# Patient Record
Sex: Female | Born: 1950 | Race: White | Hispanic: No | Marital: Married | Smoking: Never smoker
Health system: Southern US, Community
[De-identification: ages and names within clinical notes are randomized; demographics above are authoritative.]

---

## 1998-01-07 ENCOUNTER — Other Ambulatory Visit: Admission: RE | Admit: 1998-01-07 | Discharge: 1998-01-07 | Payer: Self-pay | Admitting: *Deleted

## 1999-01-11 ENCOUNTER — Other Ambulatory Visit: Admission: RE | Admit: 1999-01-11 | Discharge: 1999-01-11 | Payer: Self-pay | Admitting: *Deleted

## 2000-04-03 ENCOUNTER — Encounter (INDEPENDENT_AMBULATORY_CARE_PROVIDER_SITE_OTHER): Payer: Self-pay | Admitting: Specialist

## 2000-04-03 ENCOUNTER — Other Ambulatory Visit: Admission: RE | Admit: 2000-04-03 | Discharge: 2000-04-03 | Payer: Self-pay | Admitting: *Deleted

## 2001-04-09 ENCOUNTER — Other Ambulatory Visit: Admission: RE | Admit: 2001-04-09 | Discharge: 2001-04-09 | Payer: Self-pay | Admitting: *Deleted

## 2002-05-22 ENCOUNTER — Other Ambulatory Visit: Admission: RE | Admit: 2002-05-22 | Discharge: 2002-05-22 | Payer: Self-pay | Admitting: *Deleted

## 2002-12-15 ENCOUNTER — Encounter: Payer: Self-pay | Admitting: Emergency Medicine

## 2002-12-15 ENCOUNTER — Emergency Department (HOSPITAL_COMMUNITY): Admission: EM | Admit: 2002-12-15 | Discharge: 2002-12-15 | Payer: Self-pay | Admitting: Emergency Medicine

## 2003-07-13 ENCOUNTER — Other Ambulatory Visit: Admission: RE | Admit: 2003-07-13 | Discharge: 2003-07-13 | Payer: Self-pay | Admitting: *Deleted

## 2009-03-09 ENCOUNTER — Encounter: Admission: RE | Admit: 2009-03-09 | Discharge: 2009-03-09 | Payer: Self-pay | Admitting: Family Medicine

## 2009-03-09 IMAGING — US US SOFT TISSUE HEAD/NECK
1 series · 14 of 25 positions shown · non-contrast
Comparison: None

CLINICAL DATA: Right neck swelling -

THYROID ULTRASOUND
TECHNIQUE: Ultrasound examination of the thyroid gland and
adjacent soft tissues was performed.

[Series 1: us soft tissue head/neck · 0.06mm/px · 14 of 42 slices shown]
[im 1/42]
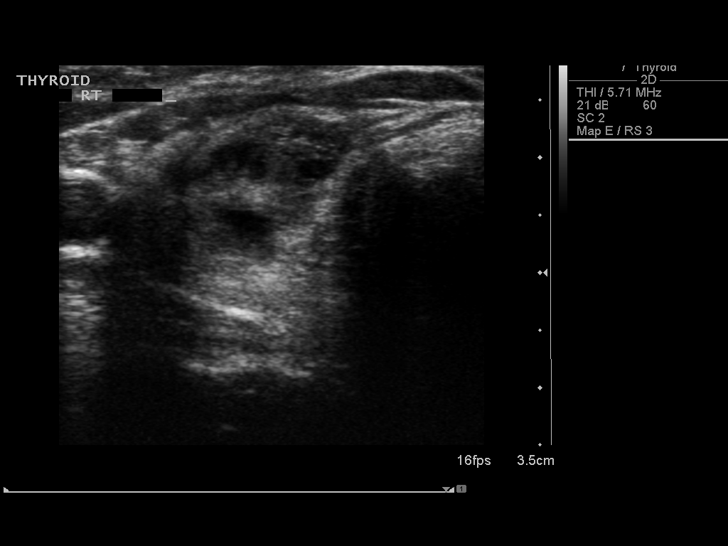
[im 4/42]
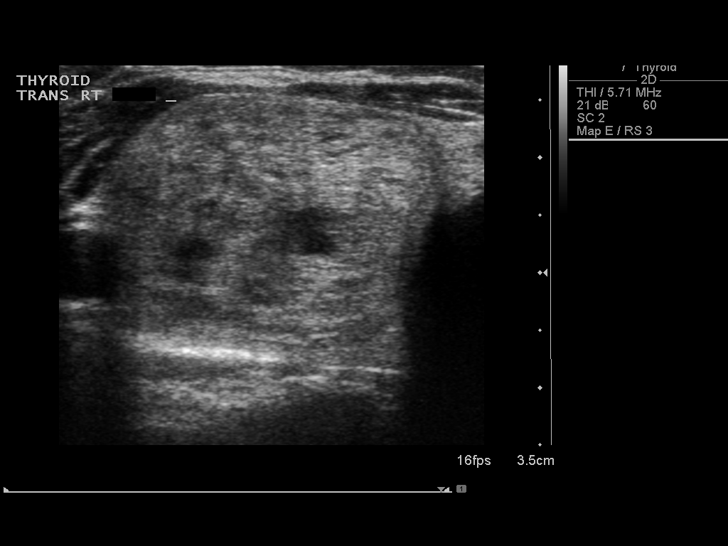
[im 7/42]
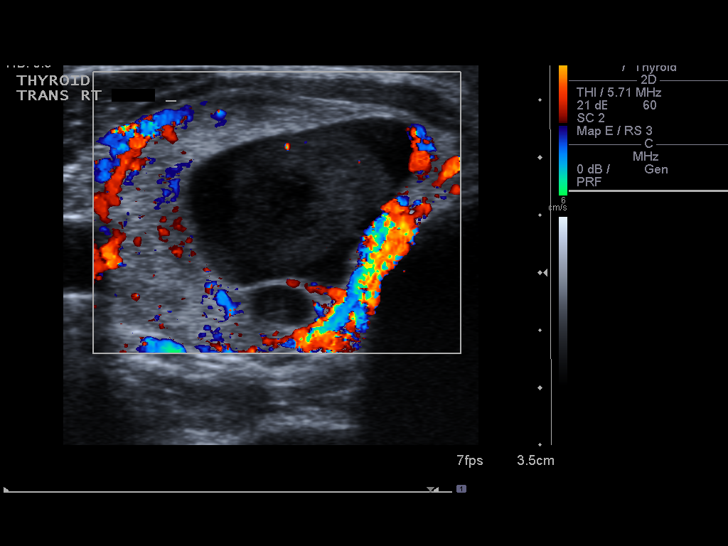
[im 11/42]
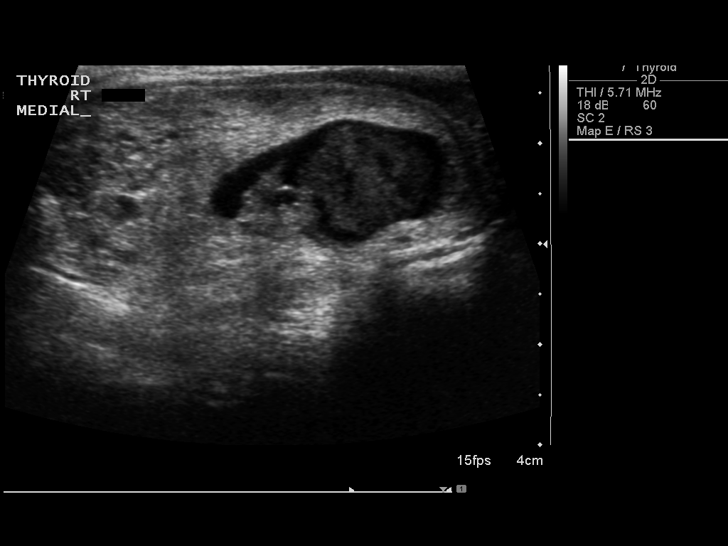
[im 14/42]
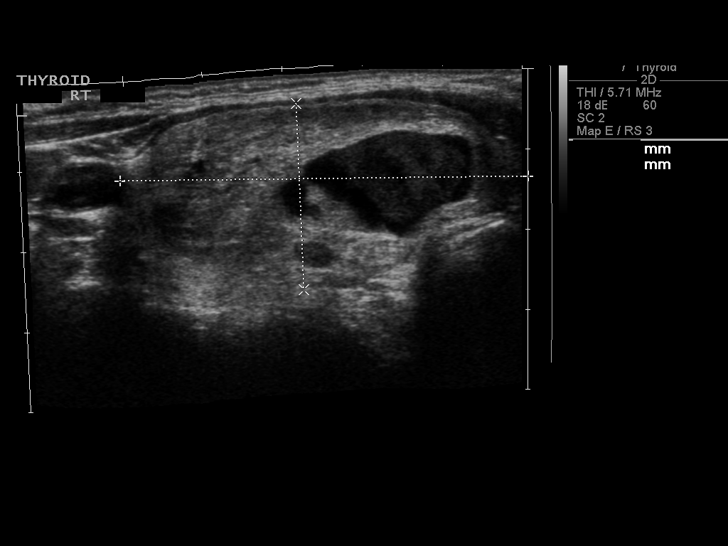
[im 16/42]
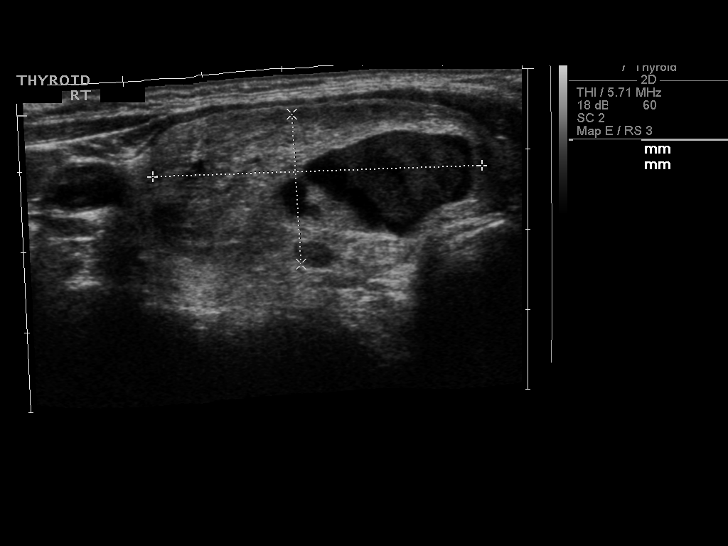
[im 19/42]
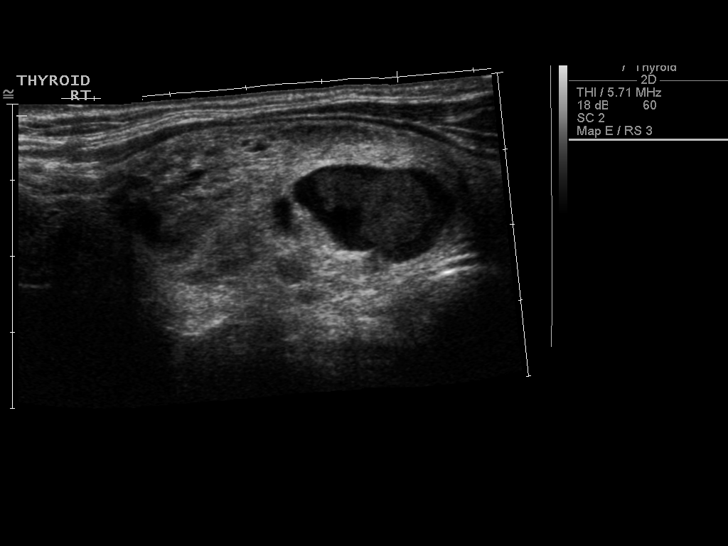
[im 23/42]
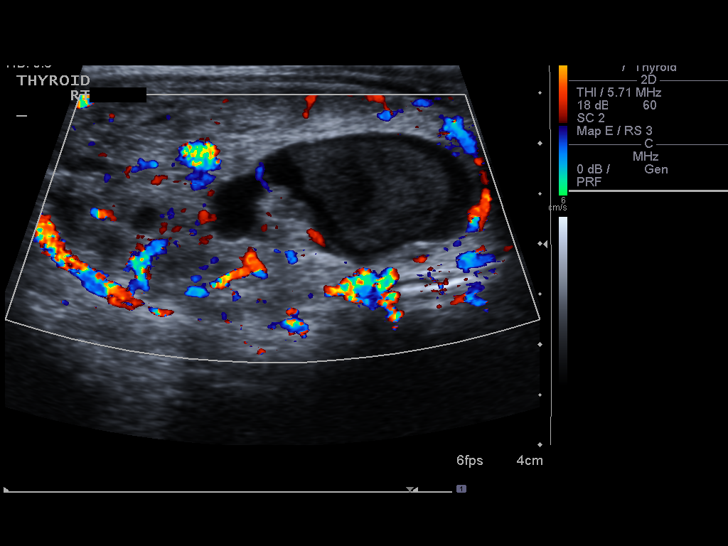
[im 26/42]
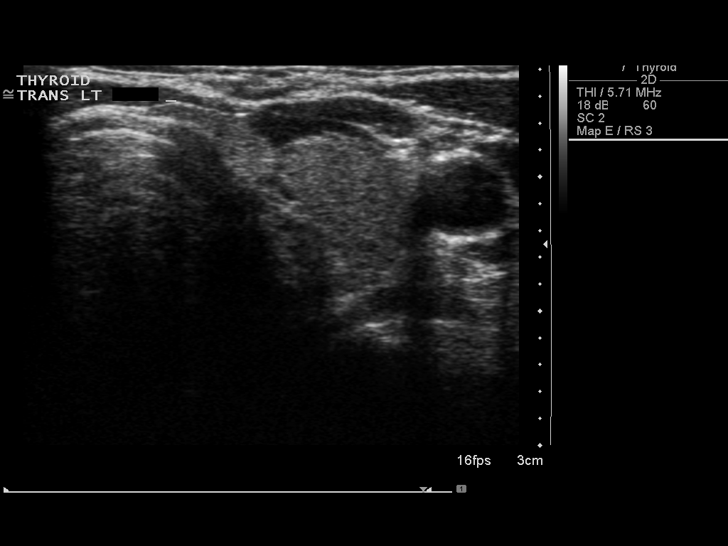
[im 28/42]
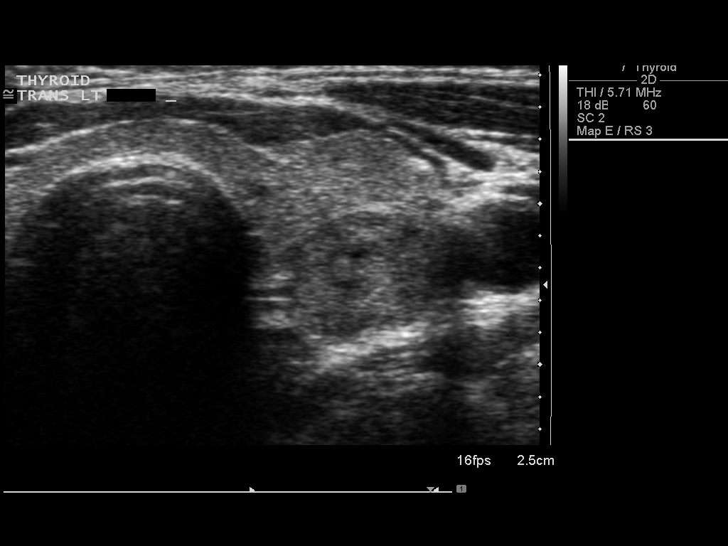
[im 31/42]
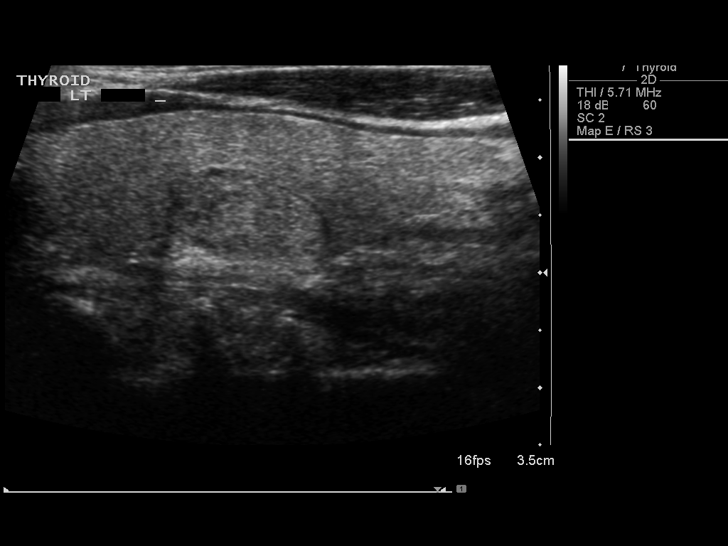
[im 35/42]
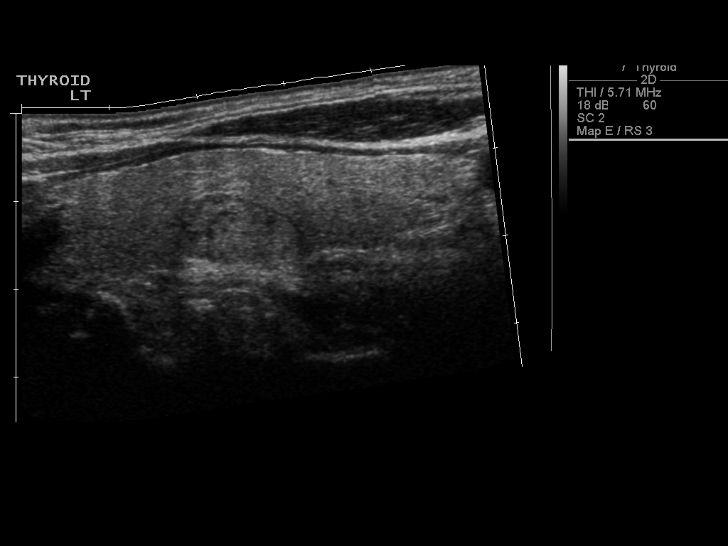
[im 38/42]
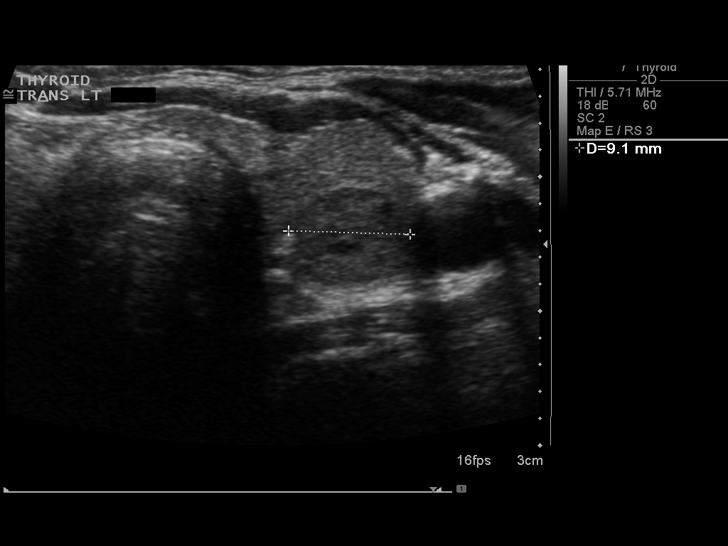
[im 42/42]
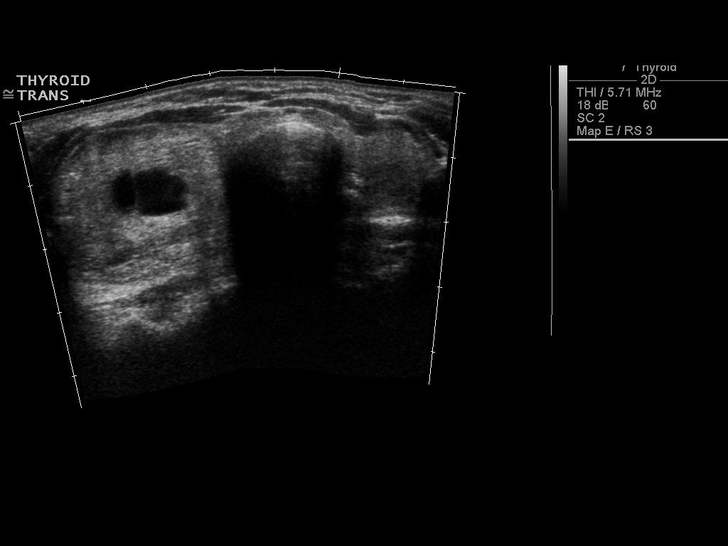

[14 of 25 positions shown; findings below may reference images not displayed]

FINDINGS: Slight enlargement right lobe thyroid measures 5.6 cm
long X 2.5 cm AP X 3.4 cm wide.  Left lobe thyroid and isthmus are
normal in size with left lobe measuring 5.0 cm long X 1.4 cm AP X
1.3 cm wide and isthmus 2 mm AP thickness.  Thyroid echotexture is
homogeneous.  At the central right lobe thyroid is solid and cystic
complex mass measuring 4.6 cm long X 2.0 cm AP X 2.9 cm wide.  At
the central posterior left lobe that is solitary solid nodule
measuring 1.6 cm long X zero point cm AP X 0.9 cm wide.
IMPRESSION: 1.  Slight enlargement right lobe thyroid with nonspecific central
solid and cystic mass.  Consider ultrasound-guided biopsy for
further characterization as clinically indicated.  Alternatively,
follow up thyroid ultrasound at 6-month intervals over 2 years to
assure stability considered.
2.  Probable left thyroid lobe adenoma.

## 2009-04-07 ENCOUNTER — Encounter (INDEPENDENT_AMBULATORY_CARE_PROVIDER_SITE_OTHER): Payer: Self-pay | Admitting: Diagnostic Radiology

## 2009-04-07 ENCOUNTER — Encounter: Admission: RE | Admit: 2009-04-07 | Discharge: 2009-04-07 | Payer: Self-pay | Admitting: Internal Medicine

## 2009-04-07 ENCOUNTER — Other Ambulatory Visit: Admission: RE | Admit: 2009-04-07 | Discharge: 2009-04-07 | Payer: Self-pay | Admitting: Diagnostic Radiology

## 2009-04-07 IMAGING — US US BIOPSY
1 series · 10 of 10 positions shown · non-contrast
Comparison: none

Addendum Begins

The fine needle aspirations were obtained with 25 gauge needles.
Addendum Ends
CLINICAL HISTORY: 58-year female with bilateral thyroid nodules.

[Series 1: us biopsy · 10 acquisitions, 10 frames shown]
[im 1/10]
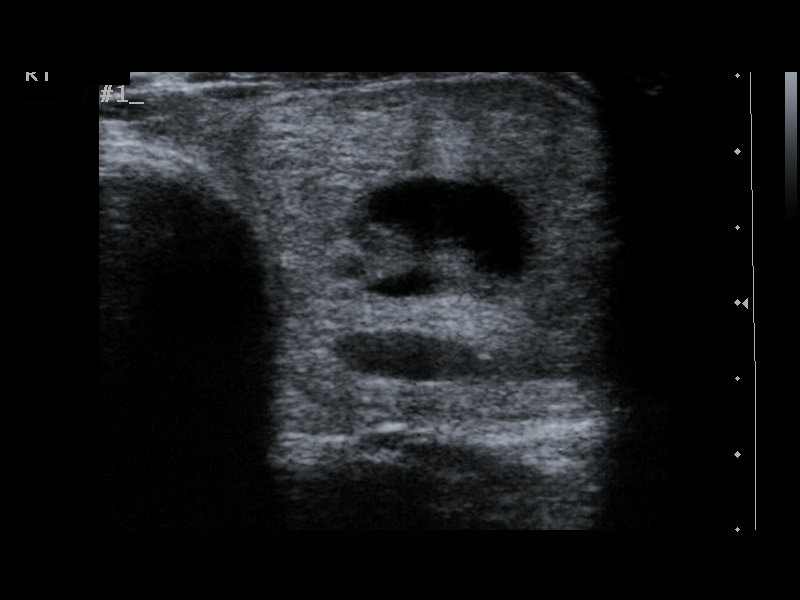
[im 2/10]
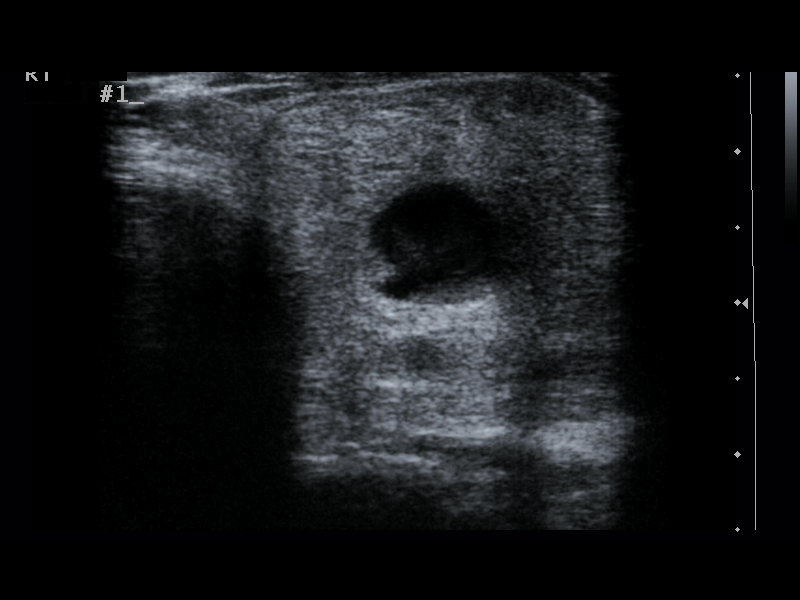
[im 3/10]
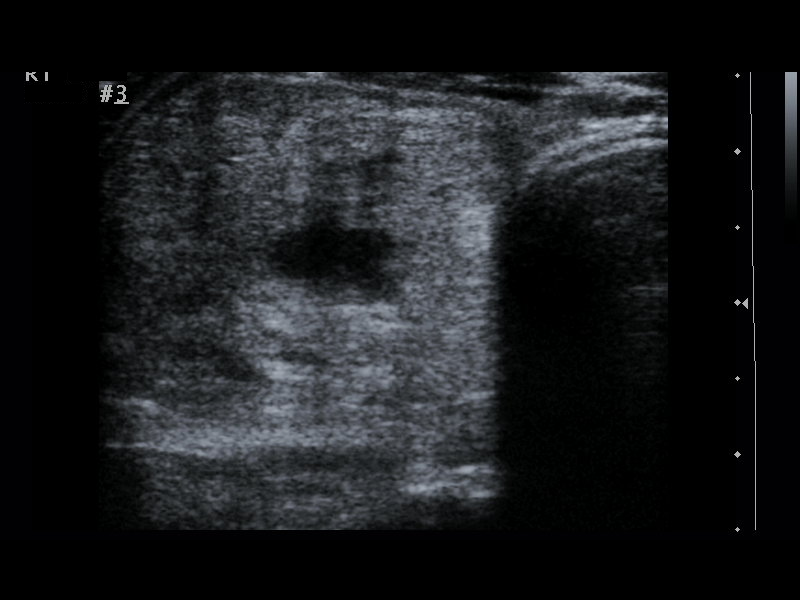
[im 4/10]
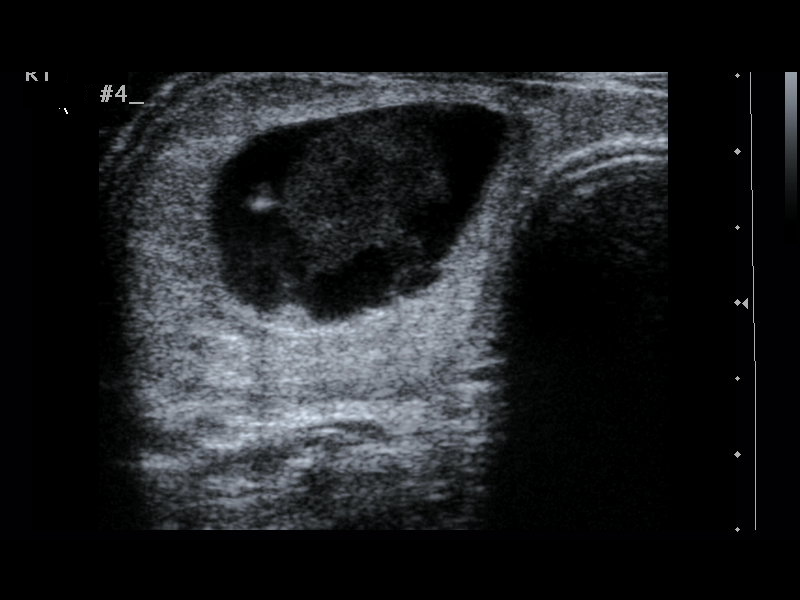
[im 5/10]
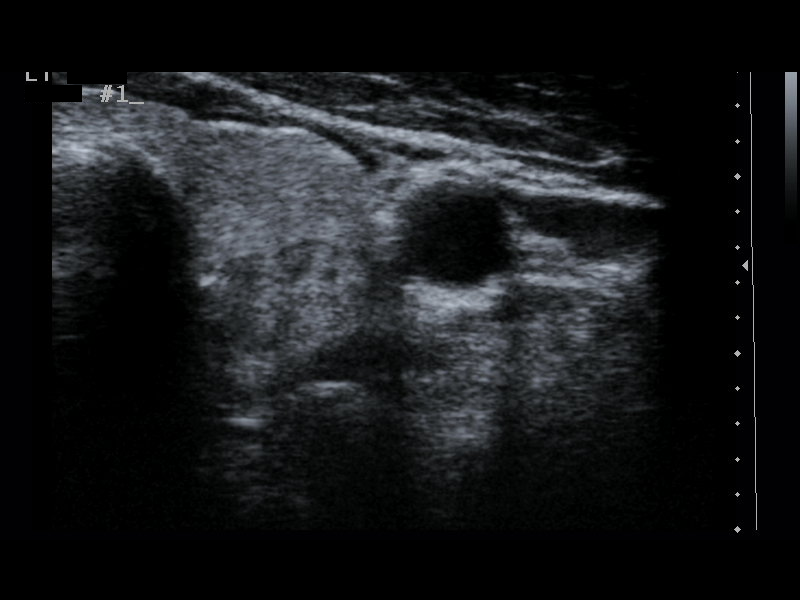
[im 6/10]
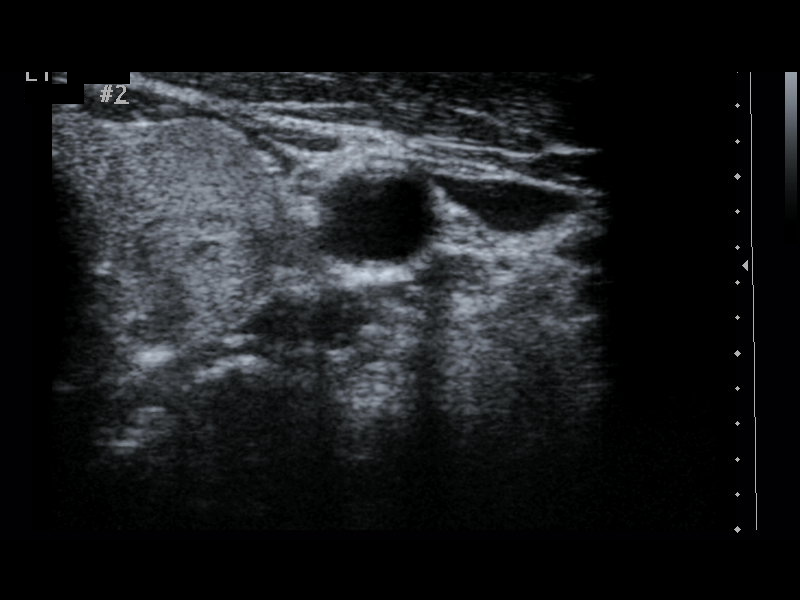
[im 7/10]
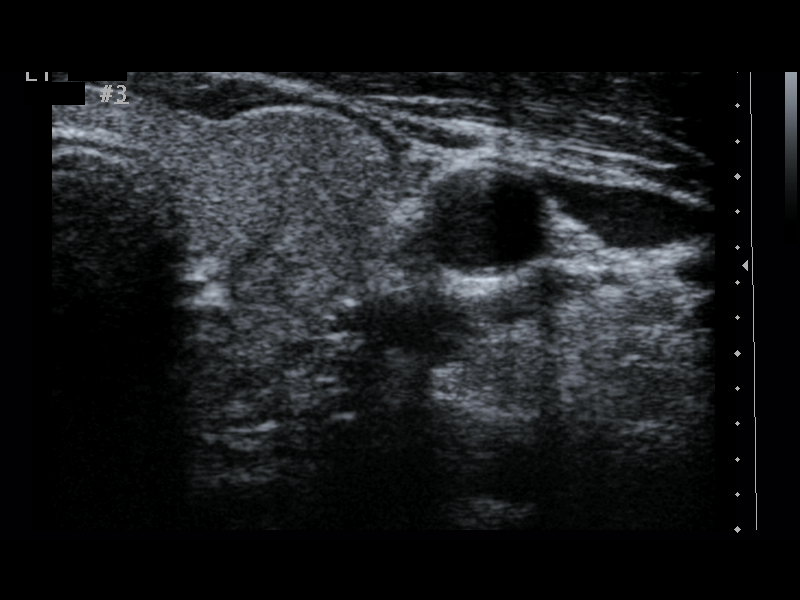
[im 8/10]
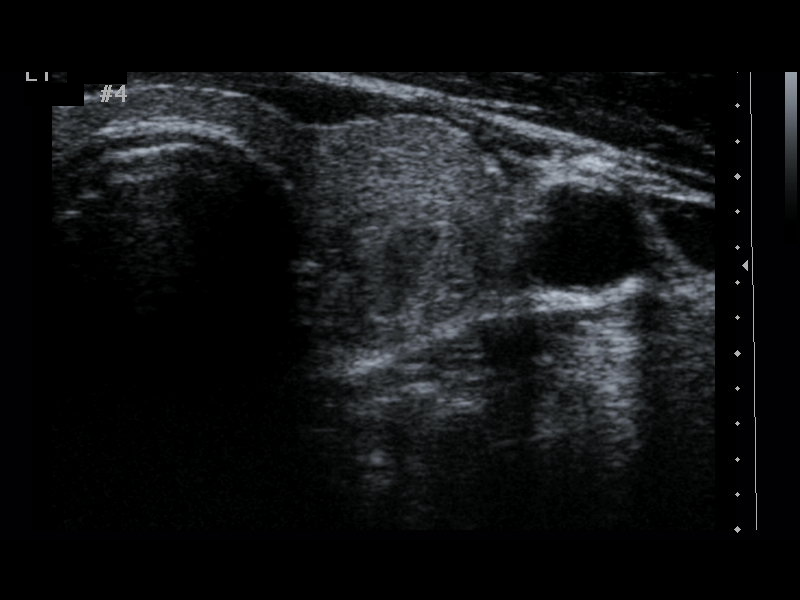
[im 9/10]
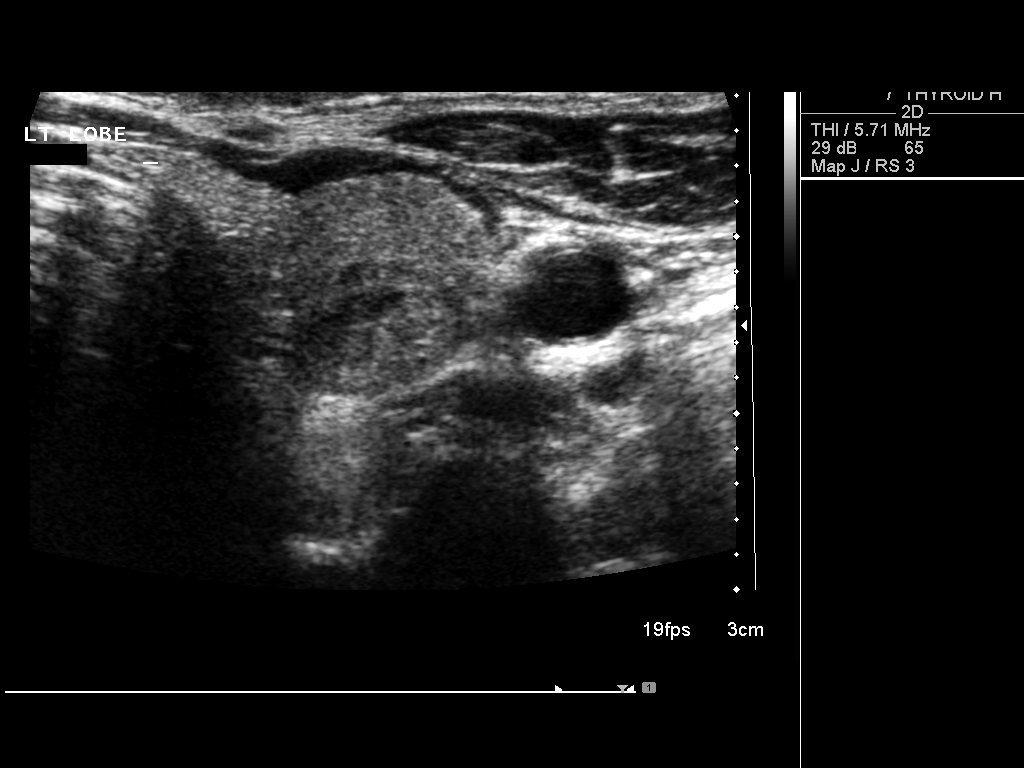
[im 10/10]
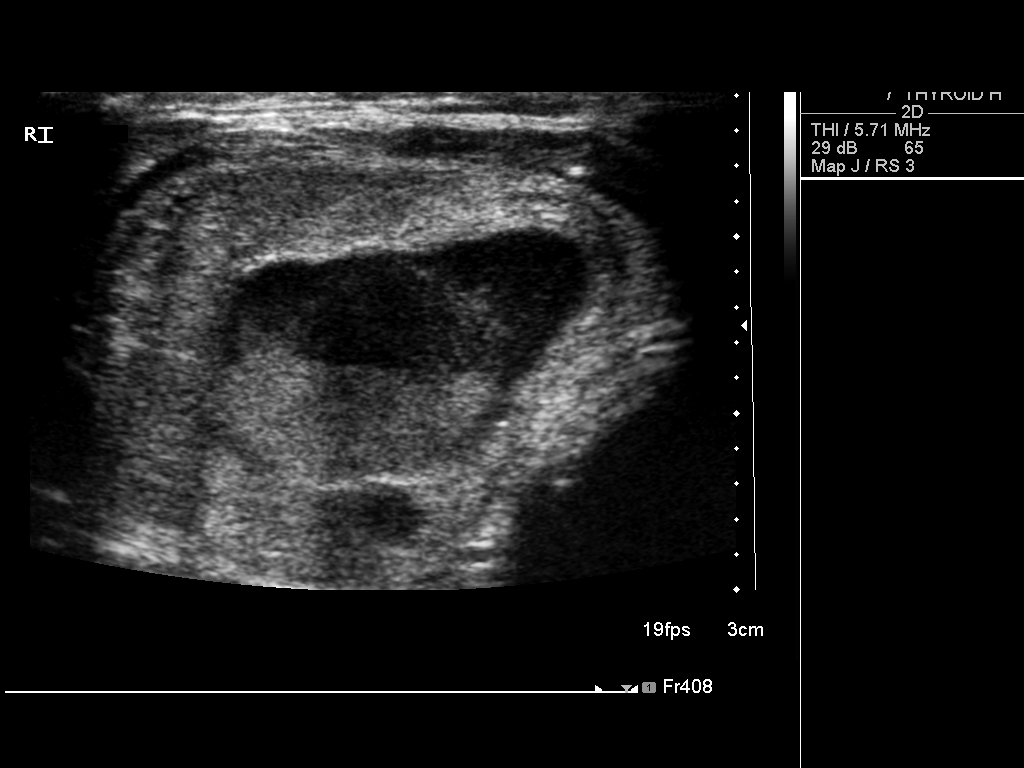

[10 of 10 positions shown; findings below may reference images not displayed]

PROCEDURE(S): ULTRASOUND GUIDED FINE NEEDLE ASPIRATIONS OF
BILATERAL THYROID NODULE IS

Medications:None

Moderate sedation time:None

Fluoroscopy time: None

Procedure:The procedure was explained to the patient.  The risks
and benefits of the procedure were discussed and the patient's
questions were addressed.  Informed consent was obtained from the
patient. The neck was prepped and draped in a sterile fashion using
Betadine.  The right neck was anesthetized was 1% lidocaine.  Four
fine needle aspirations were obtained in the right solid and cystic
lesion using ultrasound guidance.  1 ml of dark bloody fluid was
removed from the cystic component of the lesion.  Attention was
directed to the left neck.  The left neck was anesthetized with 1%
lidocaine.  The nodule along the posterior aspect of the left
thyroid lobe was targeted.  Four fine needle aspirations were
obtained in this nodule using ultrasound guidance.  There were no
immediate complications.
FINDINGS: Large solid and cystic nodule involving the right thyroid
lobe.  There was echogenic material within the cystic component
that did not aspirate well.   Nodule in the posterior left thyroid
lobe.
IMPRESSION: Ultrasound guided bilateral thyroid nodule fine needle
aspirations.

## 2010-03-30 ENCOUNTER — Encounter: Admission: RE | Admit: 2010-03-30 | Discharge: 2010-03-30 | Payer: Self-pay | Admitting: Internal Medicine

## 2010-03-30 IMAGING — US US SOFT TISSUE HEAD/NECK
1 series · 14 of 25 positions shown · non-contrast
Comparison: [DATE]

CLINICAL DATA: Goiter.  Previous aspiration biopsy of bilateral
nodules [DATE]

THYROID ULTRASOUND
TECHNIQUE: Ultrasound examination of the thyroid gland and adjacent
soft tissues was performed.

[Series 1: us soft tissue head/neck · 0.08mm/px · 14 of 29 slices shown]
[im 1/29]
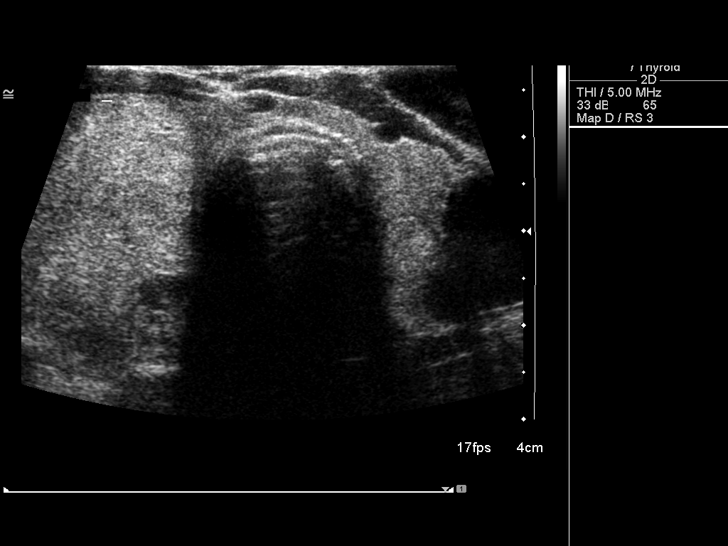
[im 3/29]
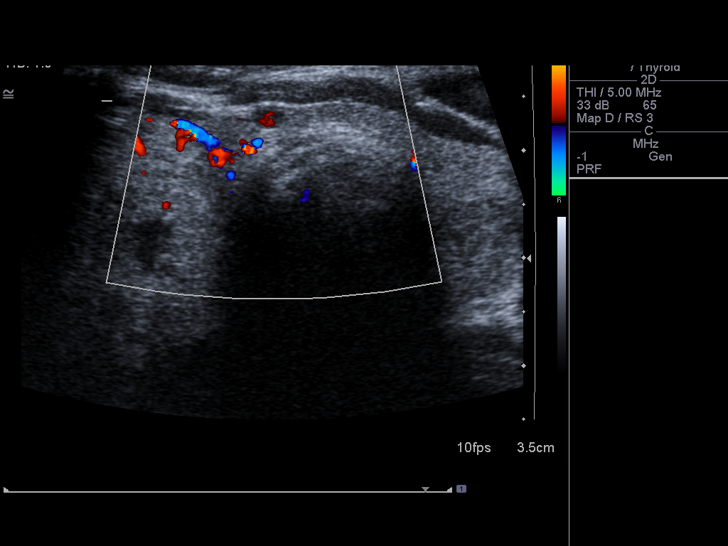
[im 5/29]
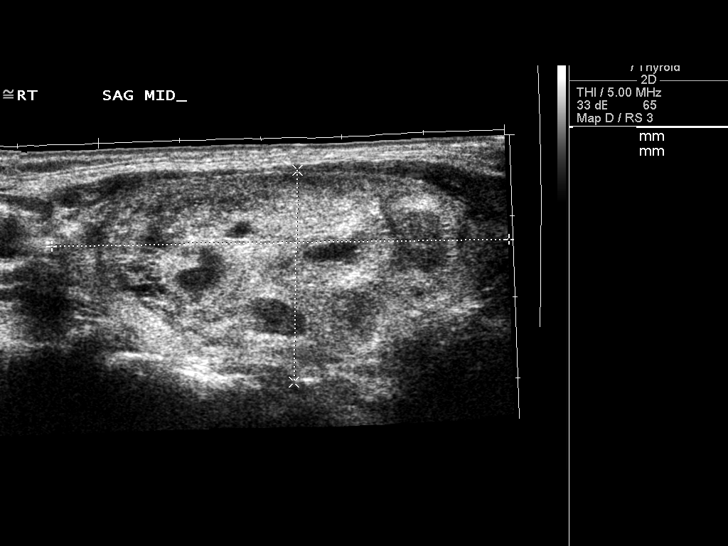
[im 8/29]
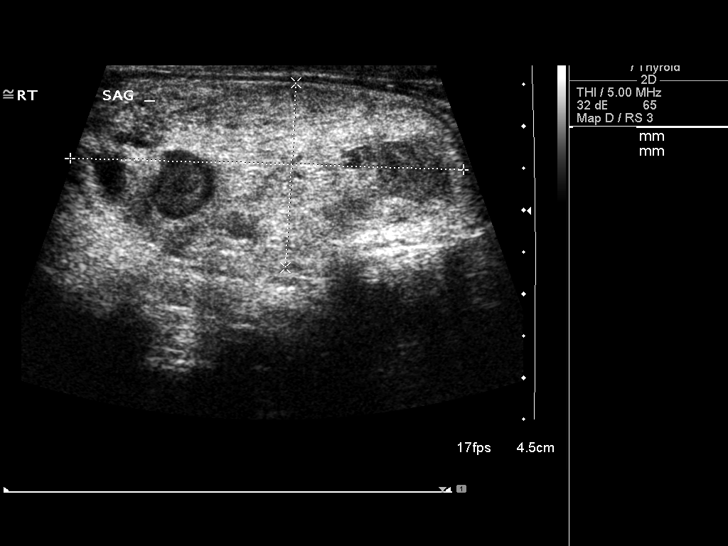
[im 10/29]
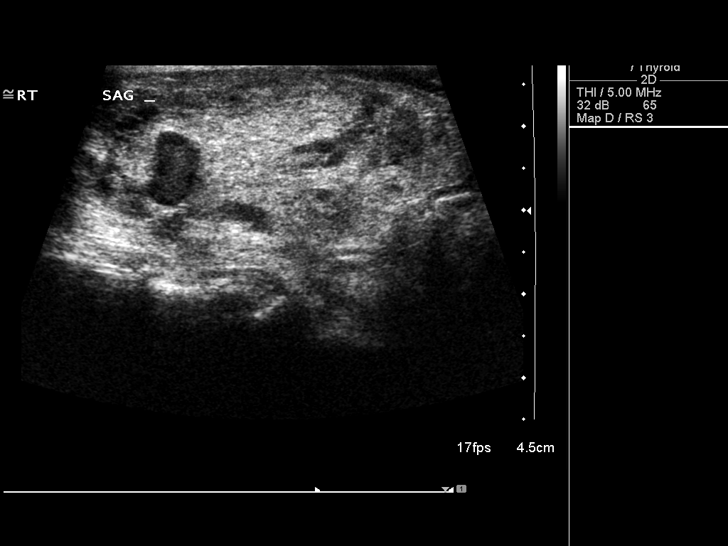
[im 11/29]
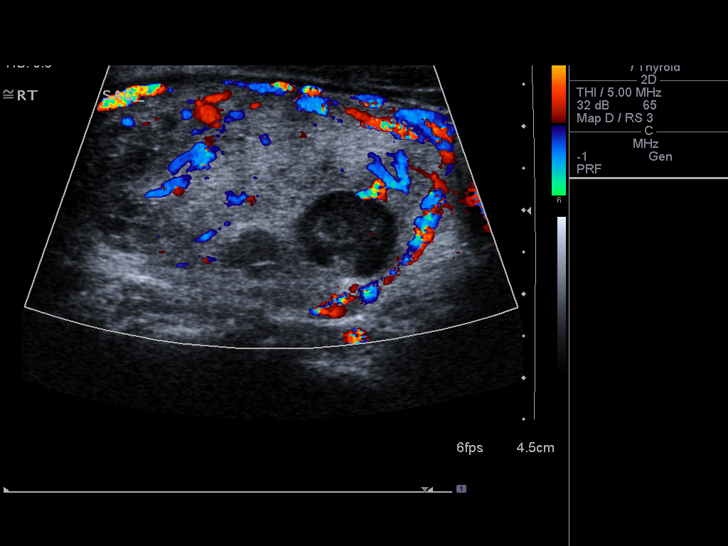
[im 13/29]
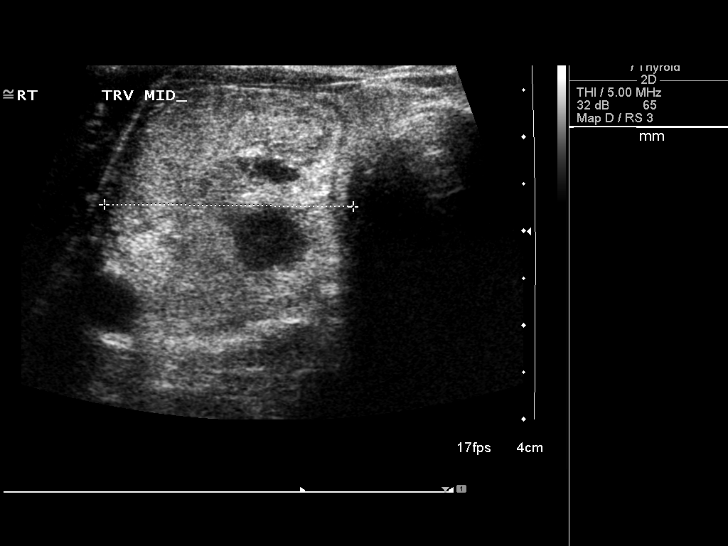
[im 16/29]
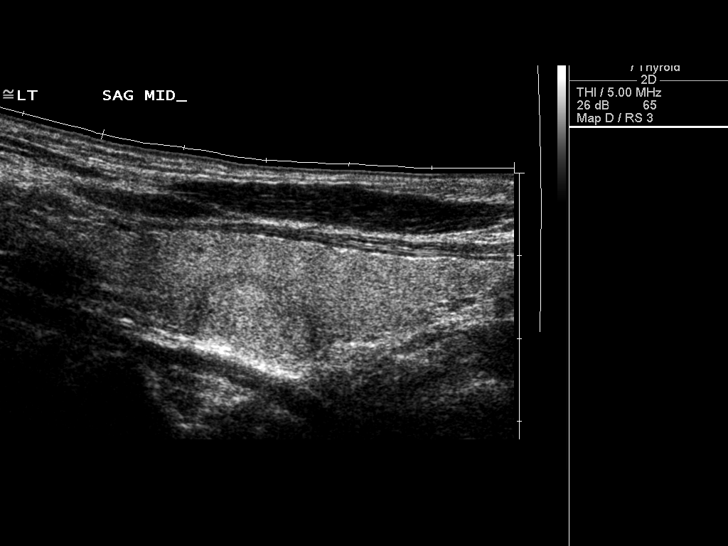
[im 18/29]
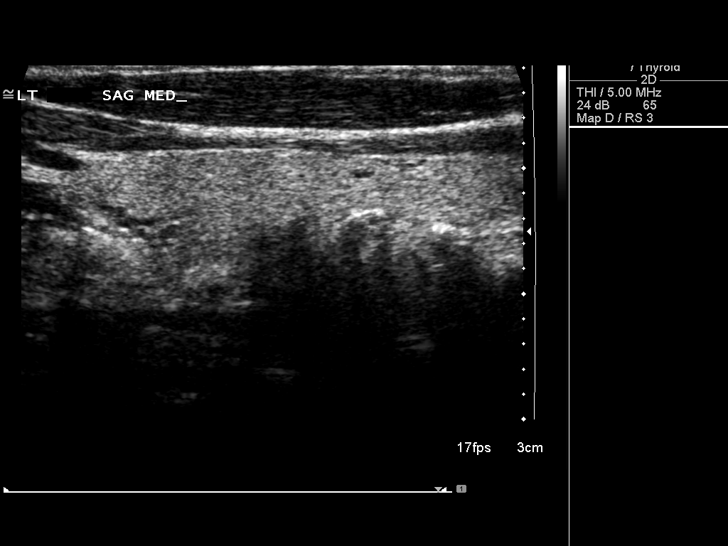
[im 19/29]
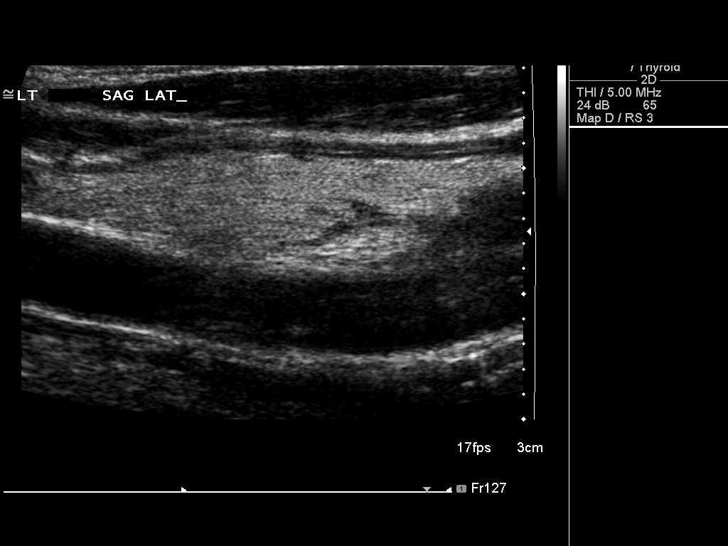
[im 22/29]
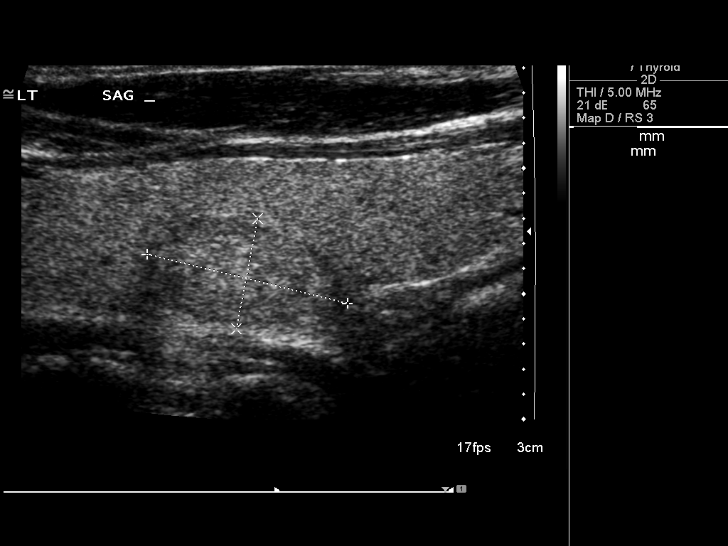
[im 24/29]
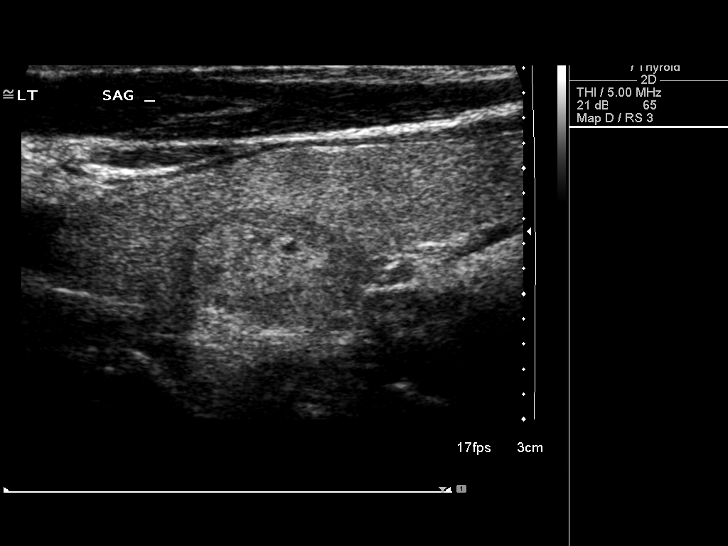
[im 26/29]
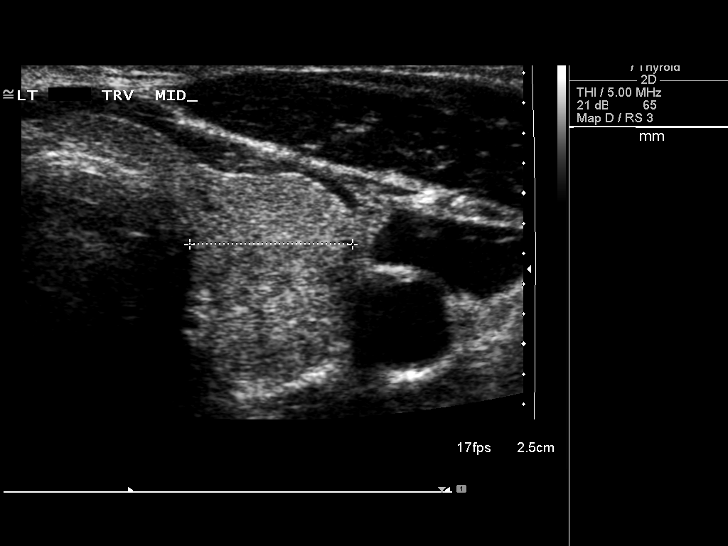
[im 29/29]
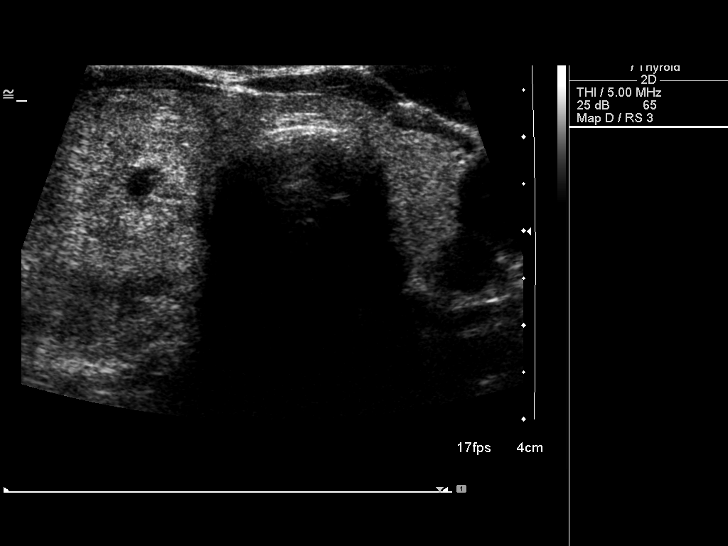

[14 of 25 positions shown; findings below may reference images not displayed]

FINDINGS: Right thyroid lobe:  26 x 26 x 56 mm, with an inhomogeneous
echotexture and a complex dominant nodule
Left thyroid lobe:  11 x 17 x 55 mm, relatively homogeneous
background parenchyma with a solitary nodule
Isthmus:  2 mm

Focal nodules:
22 x 24 x 47 mm complex mostly solid lesion, inferior right lobe
9 x 9 x 16 mm nearly isoechoic solid, mid left lobe

Lymphadenopathy:  None visualized.
IMPRESSION: 1.  Stable bilateral thyroid nodules.

## 2010-08-17 ENCOUNTER — Encounter: Payer: Self-pay | Admitting: Internal Medicine

## 2010-09-08 NOTE — Letter (Signed)
Summary: Colonoscopy Date Change Letter  [facility]  [address] [place], [place] [address]   Phone: [phone]  Fax: [phone]      August 17, 2010 MRN: [id number]   [facility] [address] [place], [place]  [address]   Dear Shelby Pace,   Previously you were recommended to have a repeat colonoscopy around this time. Your chart was recently reviewed by Dr. Lina Sar of [facility]. Follow up colonoscopy is now recommended in February 2105. This revised recommendation is based on current, nationally recognized guidelines for colorectal cancer screening and polyp surveillance. These guidelines are endorsed by the American Cancer Society, [employer] on Colorectal Cancer as well as numerous other major medical organizations.  Please understand that our recommendation assumes that you do not have any new symptoms such as bleeding, a change in bowel habits, anemia, or significant abdominal discomfort. If you do have any concerning GI symptoms or want to discuss the guideline recommendations, please call to arrange an office visit at your earliest convenience. Otherwise we will keep you in our reminder system and contact you 1-2 months prior to the date listed above to schedule your next colonoscopy.  Thank you,  Hedwig Morton. Juanda Chance, M.D.  [facility] [phone]

## 2011-11-13 IMAGING — CR DG ANKLE COMPLETE 3+V*L*
3 series · 3 of 3 positions shown · non-contrast
Comparison: None.

CLINICAL DATA: Twisting.  Pain.

LEFT ANKLE COMPLETE - 3+ VIEW

[x ankle ap left]
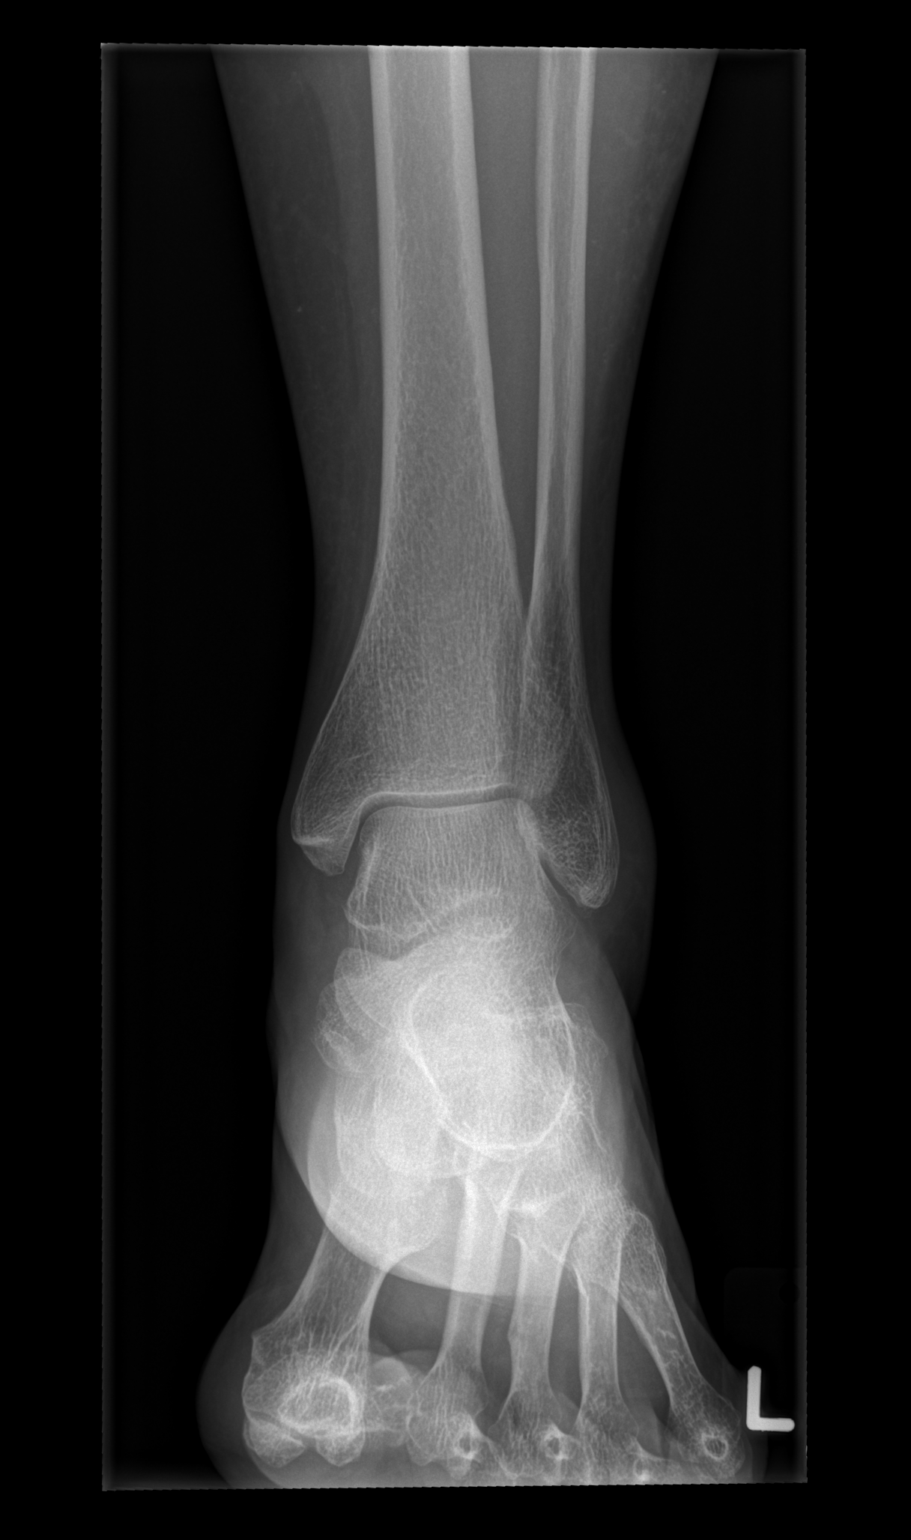

[x ankle obl left]
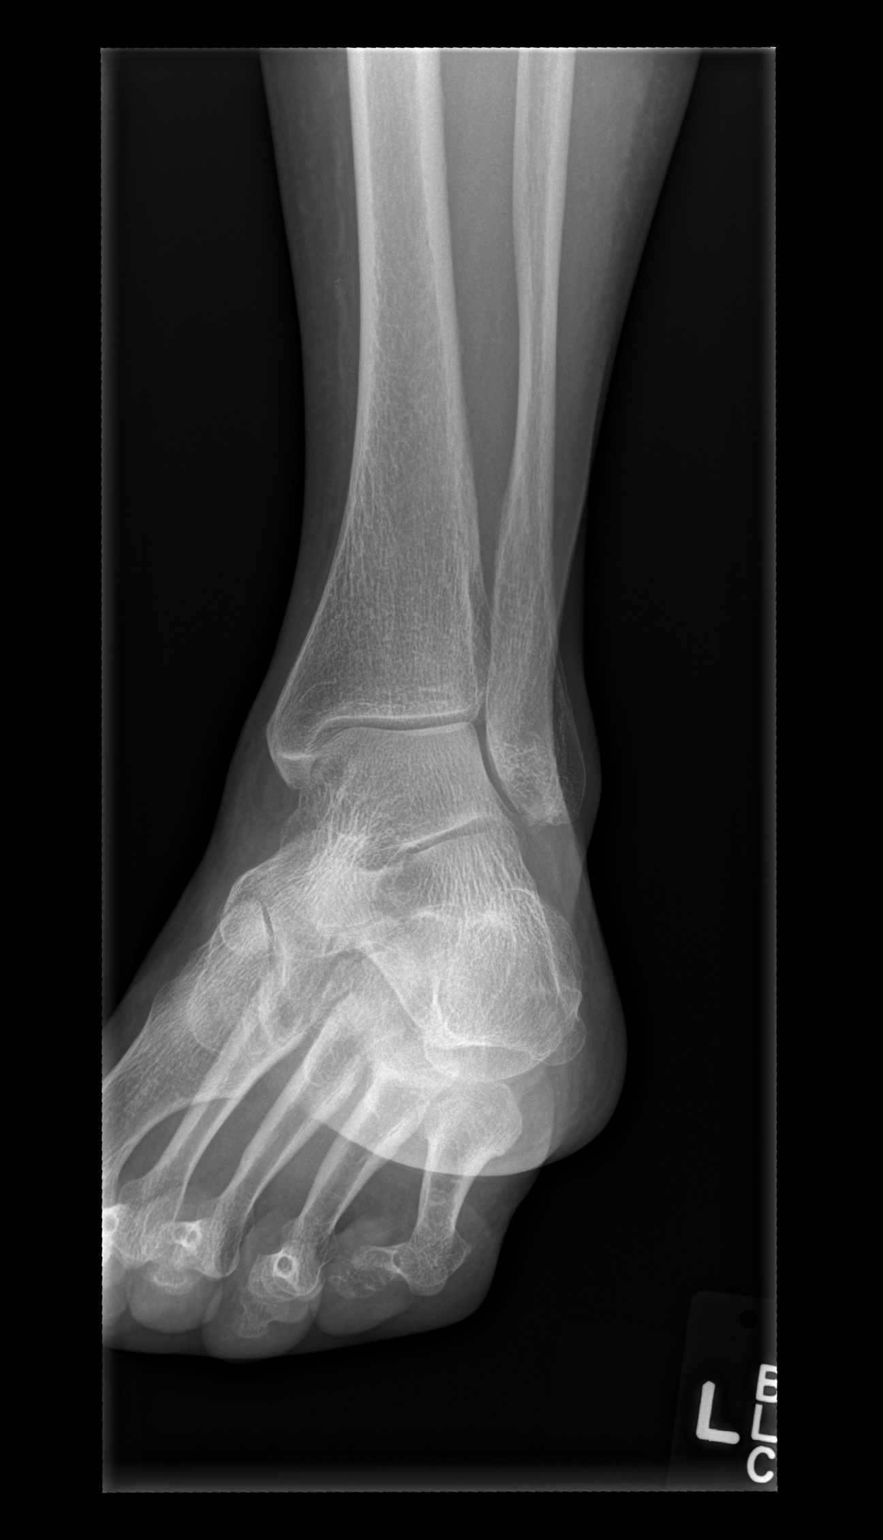

[x ankle lat left]
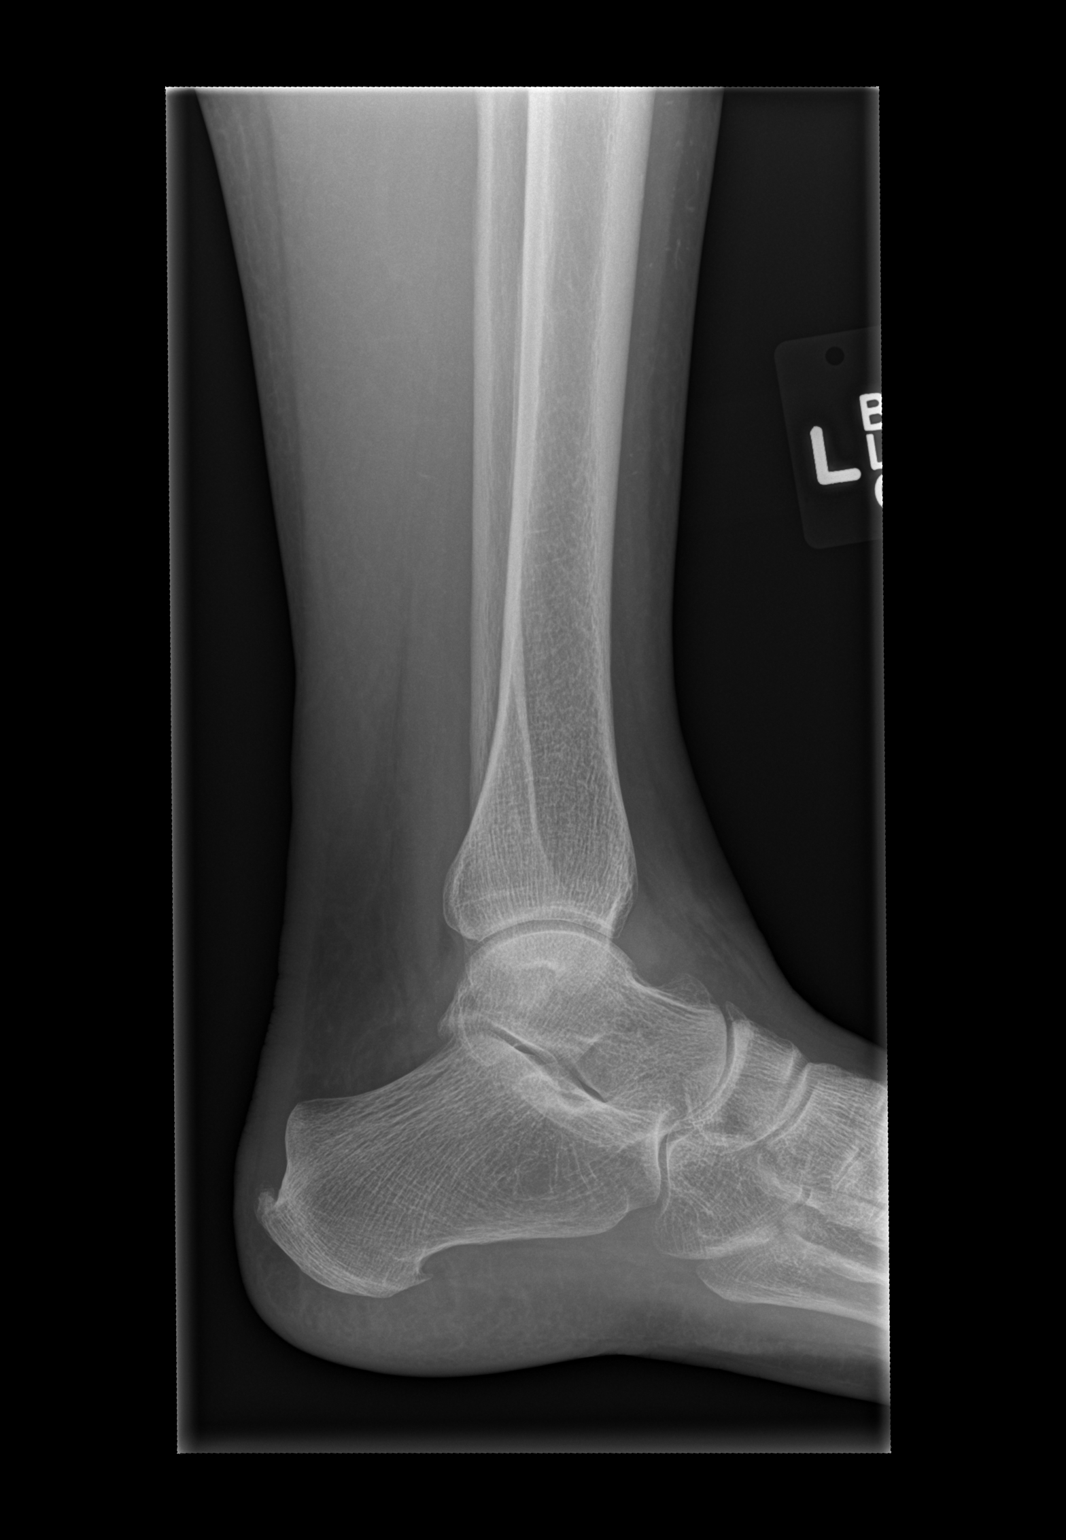

[3 of 3 positions shown; findings below may reference images not displayed]

FINDINGS: Lateral malleolar soft tissue swelling.  No underlying
fracture or dislocation.
IMPRESSION: Lateral malleolar soft tissue swelling.  No underlying fracture or
dislocation.

## 2012-09-08 ENCOUNTER — Emergency Department (HOSPITAL_COMMUNITY)
Admission: EM | Admit: 2012-09-08 | Discharge: 2012-09-09 | Disposition: A | Discharge status: Home / Self Care | Payer: BC Managed Care – PPO | Attending: Emergency Medicine | Admitting: Emergency Medicine

## 2012-09-08 ENCOUNTER — Emergency Department (HOSPITAL_COMMUNITY): Payer: BC Managed Care – PPO

## 2012-09-08 ENCOUNTER — Encounter (HOSPITAL_COMMUNITY): Payer: Self-pay | Admitting: Emergency Medicine

## 2012-09-08 DIAGNOSIS — Y939 Activity, unspecified: Secondary | ICD-10-CM | POA: Insufficient documentation

## 2012-09-08 DIAGNOSIS — S93409A Sprain of unspecified ligament of unspecified ankle, initial encounter: Secondary | ICD-10-CM

## 2012-09-08 DIAGNOSIS — X500XXA Overexertion from strenuous movement or load, initial encounter: Secondary | ICD-10-CM | POA: Insufficient documentation

## 2012-09-08 DIAGNOSIS — Y929 Unspecified place or not applicable: Secondary | ICD-10-CM | POA: Insufficient documentation

## 2012-09-08 NOTE — ED Notes (Signed)
Pt states she fell off a step and twisted her left ankle. Some slight swelling noted but no deformity.

## 2012-09-08 NOTE — ED Provider Notes (Signed)
History   This chart was scribed for non-physician practitioner working with Juliet Rude. Rubin Payor, MD by Leone Payor, ED Scribe. This patient was seen in room WTR7/WTR7 and the patient's care was started at 2224.   CSN: [id number]  Arrival date & time 09/08/12  2224   First MD Initiated Contact with Patient 09/08/12 2244      Chief Complaint  Patient presents with  . Fall     The history is provided by the patient. No language interpreter was used.    Shelby Pace is a 62 y.o. female who presents to the Emergency Department complaining of new, constant, unchanged left ankle pain after falling off a step on a brick patio and twisting her ankle. Pt reports her pain as 7-8/10. She has not tried any alleviating factors due to coming directly to ED. States she does not want anything for pain at this time. Pt denies hitting her head or LOC. No numbness or tingling.   Pt denies smoking and alcohol use.  History reviewed. No pertinent past medical history.  History reviewed. No pertinent past surgical history.  No family history on file.  History  Substance Use Topics  . Smoking status: Never Smoker   . Smokeless tobacco: Never Used  . Alcohol Use: No    No OB history provided.   Review of Systems  A complete 10 system review of systems was obtained and all systems are negative except as noted in the HPI and PMH.    Allergies  Penicillins  Home Medications  No current outpatient prescriptions on file.  BP 141/83  Pulse 76  Temp 98.3 F (36.8 C) (Oral)  Resp 20  Wt 150 lb (68.04 kg)  SpO2 98%  Physical Exam  Nursing note and vitals reviewed. Constitutional: She is oriented to person, place, and time. She appears well-developed and well-nourished. No distress.  HENT:  Head: Normocephalic and atraumatic.  Eyes: Conjunctivae normal and EOM are normal.  Neck: Neck supple. No tracheal deviation present.  Cardiovascular: Normal rate, regular rhythm, normal  heart sounds and intact distal pulses.   Pulses:      Dorsalis pedis pulses are 2+ on the left side.       Posterior tibial pulses are 2+ on the left side.  Pulmonary/Chest: Effort normal and breath sounds normal. No respiratory distress. She has no wheezes.  Musculoskeletal: Normal range of motion. She exhibits edema and tenderness.       Left ankle: tenderness. Lateral malleolus, AITFL and CF ligament tenderness found.       Swelling laterally.   Neurological: She is alert and oriented to person, place, and time. No sensory deficit.  Skin: Skin is warm and dry.       Few small abrasions over anterior aspect of L foot. No active bleeding.  Psychiatric: She has a normal mood and affect. Her behavior is normal.    ED Course  Procedures (including critical care time)  DIAGNOSTIC STUDIES: Oxygen Saturation is 98% on room air, normal by my interpretation.    COORDINATION OF CARE: 10:59 PM Discussed treatment plan which includes imaging of left ankle with pt at bedside and pt agreed to plan.     Labs Reviewed - No data to display Dg Ankle Complete Left  09/08/2012  *RADIOLOGY REPORT*  Clinical Data: Twisting.  Pain.  LEFT ANKLE COMPLETE - 3+ VIEW  Comparison: None.  Findings: Lateral malleolar soft tissue swelling.  No underlying fracture or dislocation.  IMPRESSION: Lateral malleolar  soft tissue swelling.  No underlying fracture or dislocation.   Original Report Authenticated By: Lacy Duverney, M.D.      1. Ankle sprain       MDM  62 year old female with ankle sprain. No acute bony abnormality seen on x-ray. Ace wrap given. She has a walking boot along with Aleve up ankle brace at home. Patient does not want crutches at this time. I advised her to rest, ice and elevate her leg. Return precautions discussed. Patient states her understanding of plan and is agreeable     I personally performed the services described in this documentation, which was scribed in my presence. The recorded  information has been reviewed and is accurate.   Trevor Mace, PA-C 09/09/12 0000

## 2012-09-09 NOTE — ED Provider Notes (Signed)
Medical screening examination/treatment/procedure(s) were performed by non-physician practitioner and as supervising physician I was immediately available for consultation/collaboration.  Juliet Rude. Rubin Payor, MD 09/09/12 4540

## 2012-12-03 ENCOUNTER — Other Ambulatory Visit: Payer: Self-pay | Admitting: Family Medicine

## 2012-12-03 DIAGNOSIS — E042 Nontoxic multinodular goiter: Secondary | ICD-10-CM

## 2012-12-11 ENCOUNTER — Other Ambulatory Visit: Payer: Self-pay | Admitting: Family Medicine

## 2012-12-11 ENCOUNTER — Ambulatory Visit
Admission: RE | Admit: 2012-12-11 | Discharge: 2012-12-11 | Disposition: A | Discharge status: Home / Self Care | Payer: BC Managed Care – PPO | Source: Ambulatory Visit | Attending: Family Medicine | Admitting: Family Medicine

## 2012-12-11 DIAGNOSIS — E041 Nontoxic single thyroid nodule: Secondary | ICD-10-CM

## 2012-12-11 DIAGNOSIS — E042 Nontoxic multinodular goiter: Secondary | ICD-10-CM

## 2012-12-11 IMAGING — US US SOFT TISSUE HEAD/NECK
1 series · 13 of 25 positions shown · non-contrast
Comparison: Thyroid ultrasound [DATE] and [DATE].

CLINICAL DATA: Goiter.  History of thyroid nodules.  History of
bilateral thyroid nodule FNA [DATE].

THYROID ULTRASOUND
TECHNIQUE: Ultrasound examination of the thyroid gland and adjacent
soft tissues was performed.

[Series 1: us soft tissue head/neck · 0.11mm/px · 13 of 56 slices shown]
[im 1/56]
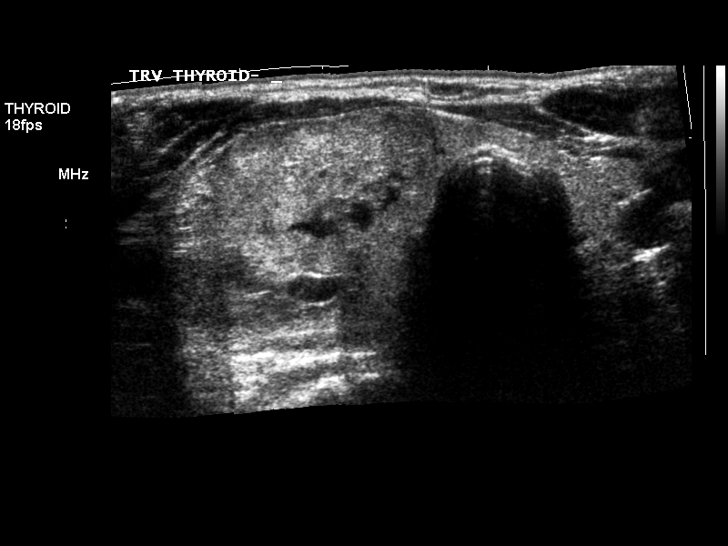
[im 5/56]
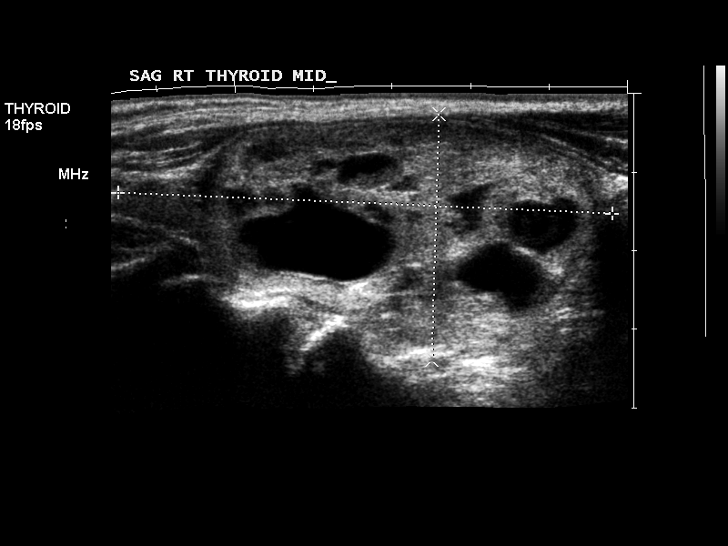
[im 10/56]
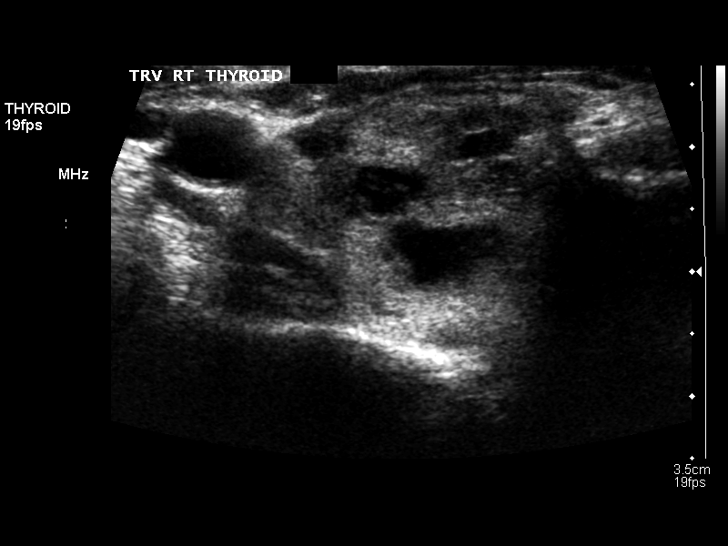
[im 14/56]
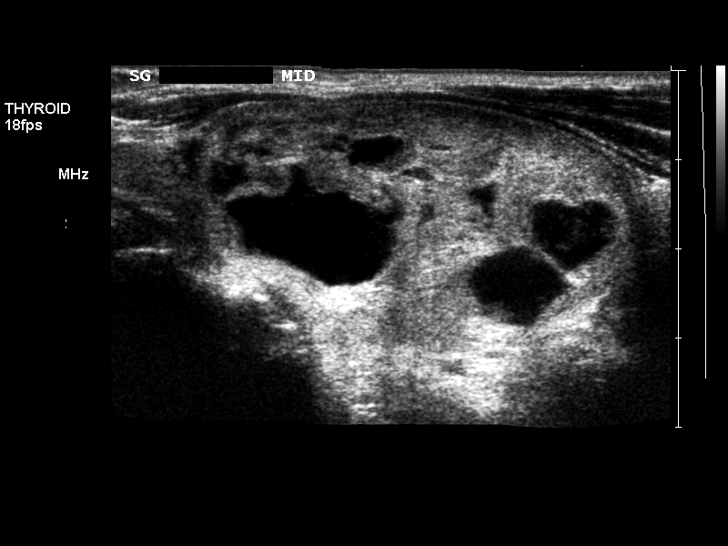
[im 19/56]
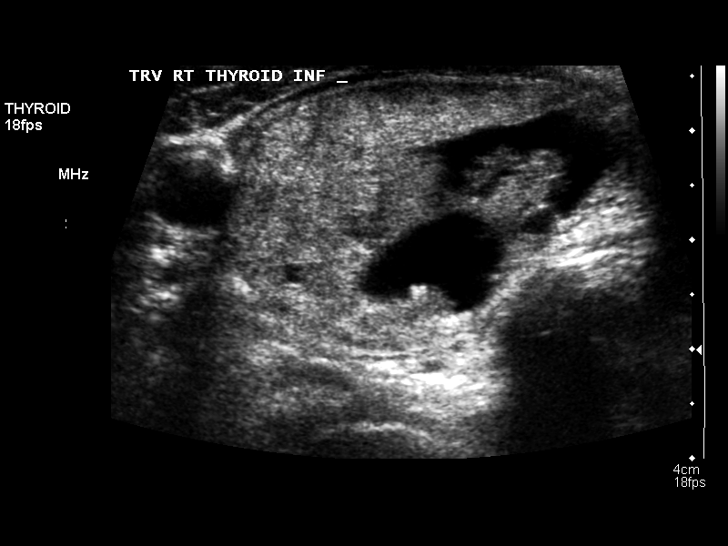
[im 23/56]
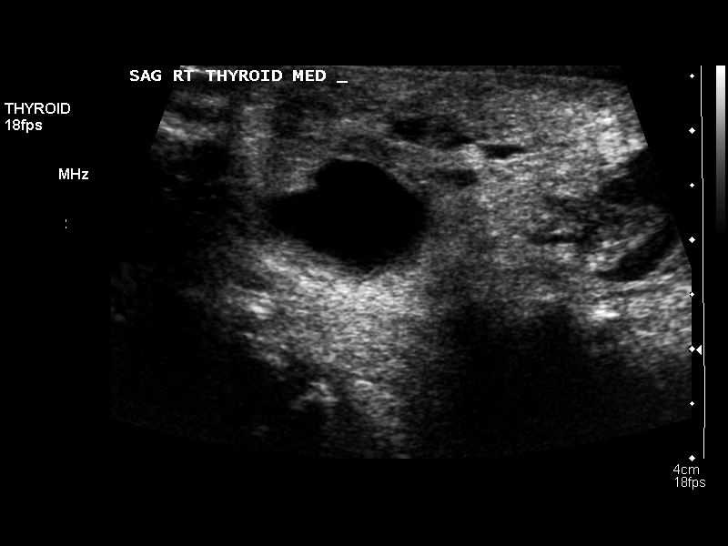
[im 28/56]
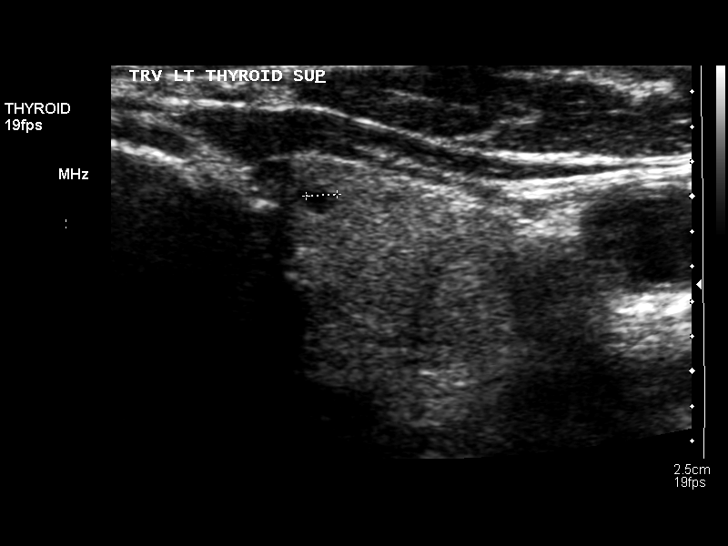
[im 33/56]
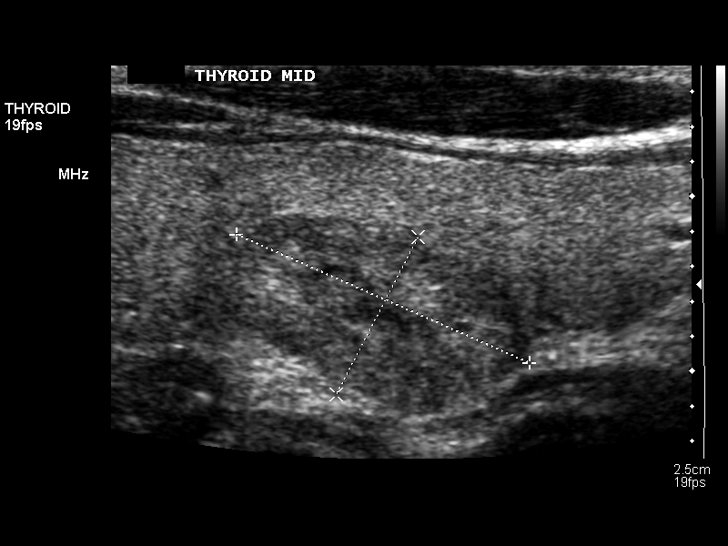
[im 37/56]
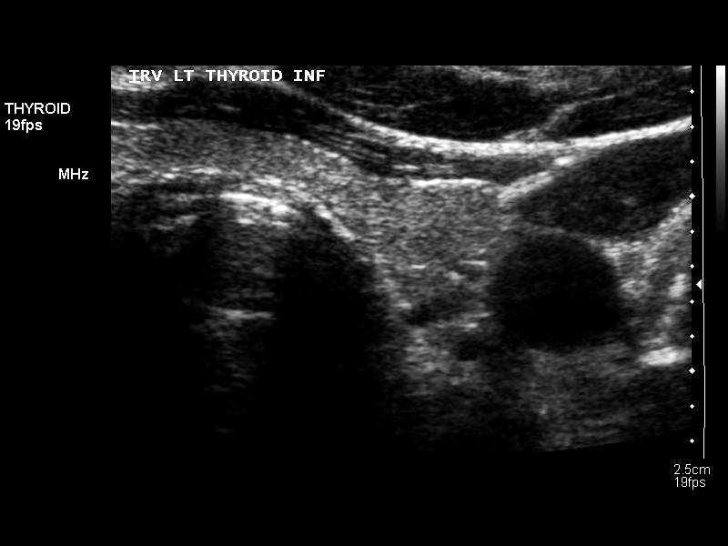
[im 42/56]
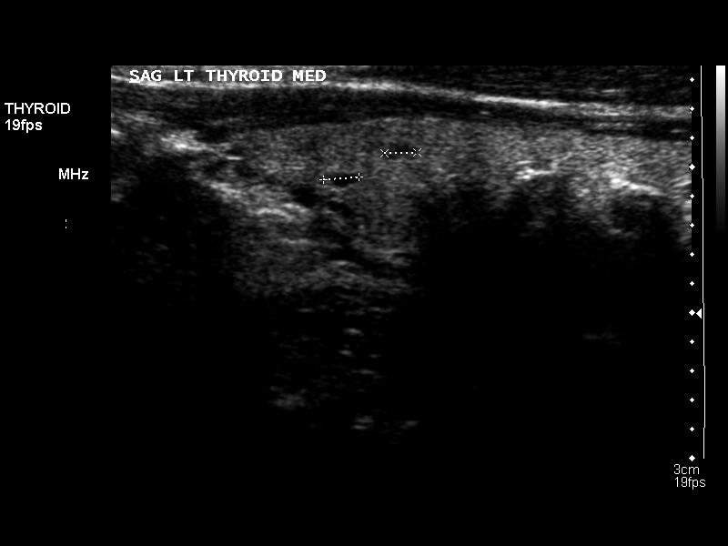
[im 46/56]
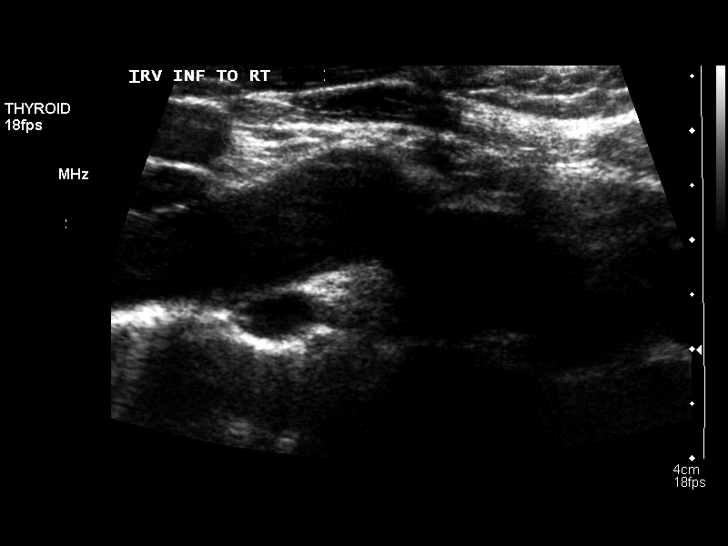
[im 51/56]
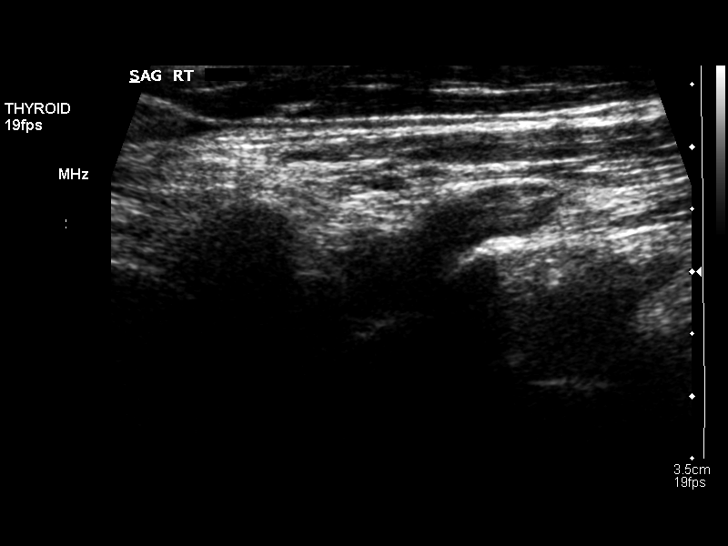
[im 56/56]
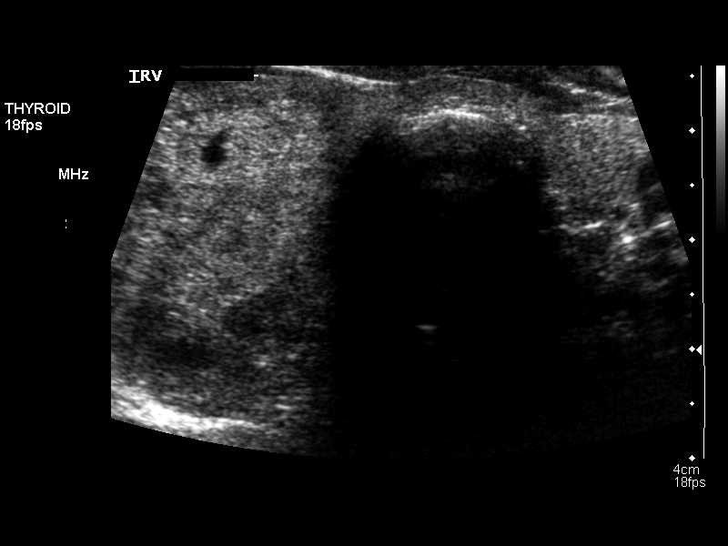

[13 of 25 positions shown; findings below may reference images not displayed]

FINDINGS: Right thyroid lobe:  6.3 x 3.1 x 3.4 cm.
Left thyroid lobe:  5.1 x 1.5 x 1.2 cm.
Isthmus:  2.6 mm in thickness.

Focal nodules:  The thyroid echotexture is heterogeneous on the
right.  There is a dominant solid and cystic lesion occupying most
of the right thyroid lobe.  This currently measures 5.1 x 4.0 x
cm.  It is cystic components have enlarged compared with the prior
study.  Previous dimensions of this nodule were 4.7 x 2.2 x 2.4 cm
and 4.6 x 2.0 x 2.9 cm on the original study.  On the left, the
gland is more homogeneous in echo texture.  There is a solid nodule
medially in the left lobe which measures 1.1 x 1.8 x 1.0 cm
(previously 0.9 x 1.6 x 0.9 cm).

Lymphadenopathy:  None visualized.
IMPRESSION: 1.  Interval enlargement of a dominant complex right thyroid
nodule.  This is the nodule which was originally biopsied in [Q4].
Findings meet consensus criteria for biopsy.  Ultrasound-guided
fine needle aspiration should be considered, as per the consensus
statement: Management of Thyroid Nodules Detected at US:  Society
of Radiologists in Ultrasound Consensus Conference Statement.
2.  Stable solid nodule in the left lobe.

## 2012-12-12 ENCOUNTER — Inpatient Hospital Stay: Admission: RE | Admit: 2012-12-12 | Payer: BC Managed Care – PPO | Source: Ambulatory Visit

## 2012-12-18 ENCOUNTER — Ambulatory Visit
Admission: RE | Admit: 2012-12-18 | Discharge: 2012-12-18 | Disposition: A | Discharge status: Home / Self Care | Payer: BC Managed Care – PPO | Source: Ambulatory Visit | Attending: Family Medicine | Admitting: Family Medicine

## 2012-12-18 DIAGNOSIS — E041 Nontoxic single thyroid nodule: Secondary | ICD-10-CM

## 2012-12-24 ENCOUNTER — Ambulatory Visit
Admission: RE | Admit: 2012-12-24 | Discharge: 2012-12-24 | Disposition: A | Discharge status: Home / Self Care | Payer: BC Managed Care – PPO | Source: Ambulatory Visit | Attending: Family Medicine | Admitting: Family Medicine

## 2012-12-24 ENCOUNTER — Other Ambulatory Visit (HOSPITAL_COMMUNITY)
Admission: RE | Admit: 2012-12-24 | Discharge: 2012-12-24 | Disposition: A | Discharge status: Home / Self Care | Payer: BC Managed Care – PPO | Source: Ambulatory Visit | Attending: Interventional Radiology | Admitting: Interventional Radiology

## 2012-12-24 DIAGNOSIS — E049 Nontoxic goiter, unspecified: Secondary | ICD-10-CM | POA: Insufficient documentation

## 2012-12-24 IMAGING — US US THYROID BIOPSY
1 series · 13 of 14 positions shown · non-contrast
Comparison: Prior thyroid ultrasound [DATE];

CLINICAL DATA: Enlarging right thyroid biopsy which was previously
biopsied in [TI].  Interval enlargement meets consensus criteria
for repeat fine needle aspiration biopsy.

ULTRASOUND GUIDED NEEDLE ASPIRATE BIOPSY OF THE THYROID GLAND

[Series 1: us thyroid biopsy · 0.08mm/px · 14 acquisitions, 13 frames shown]
[im 1/14]
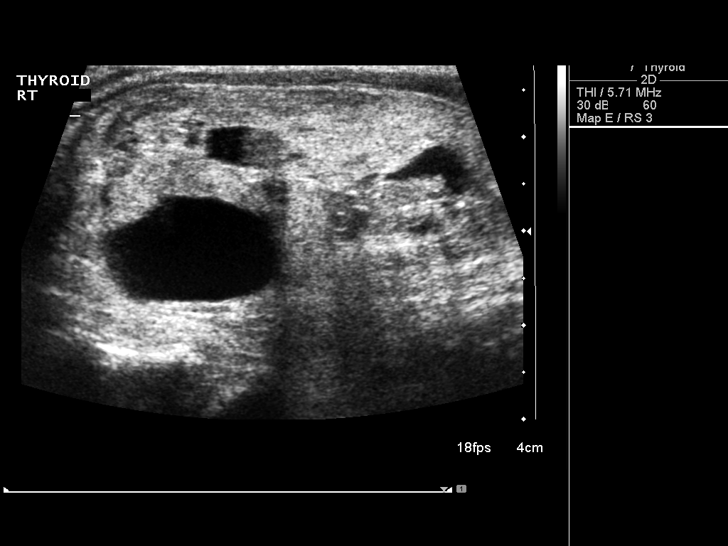
[im 2/14]
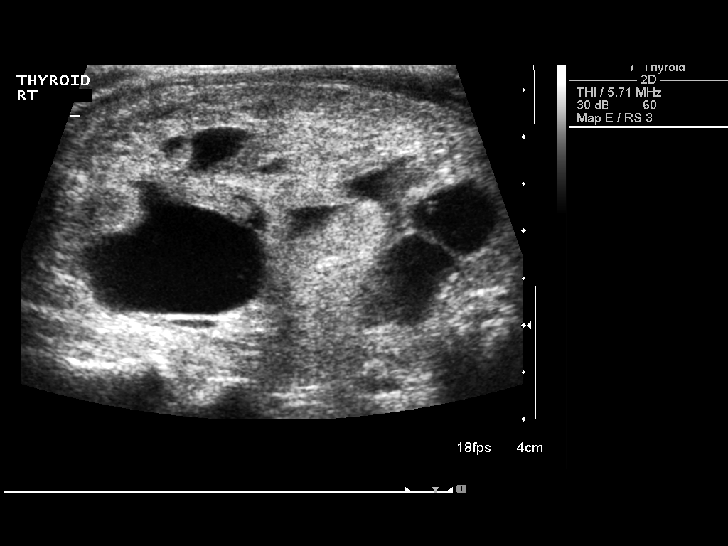
[im 3/14]
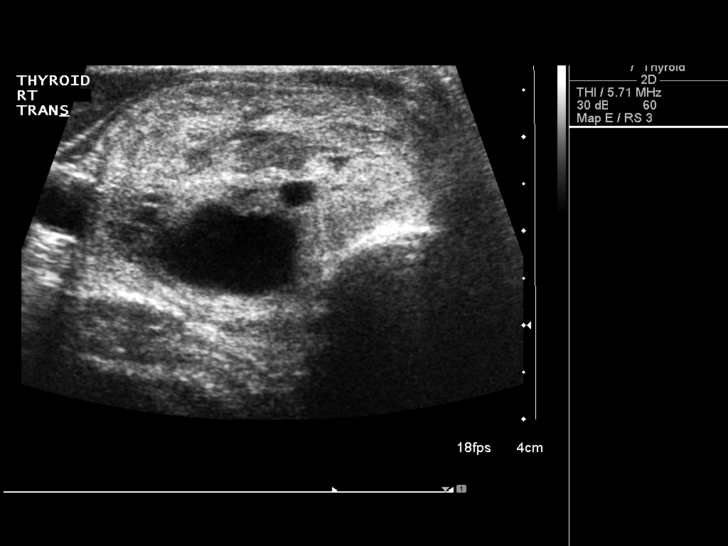
[im 4/14]
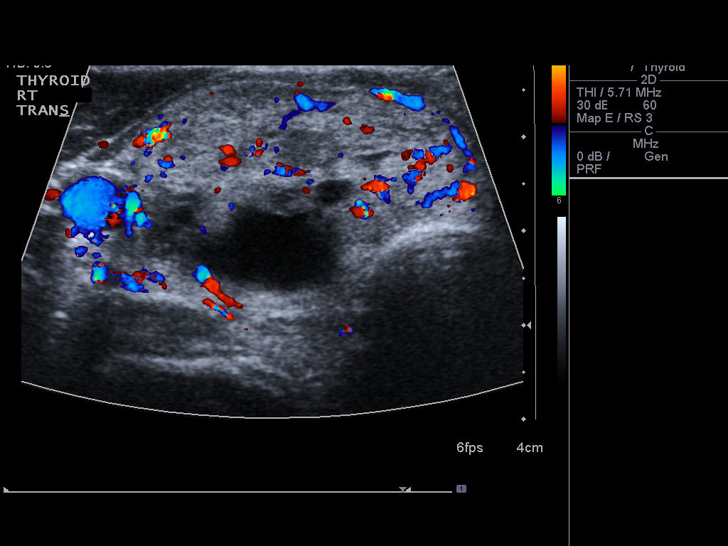
[im 5/14]
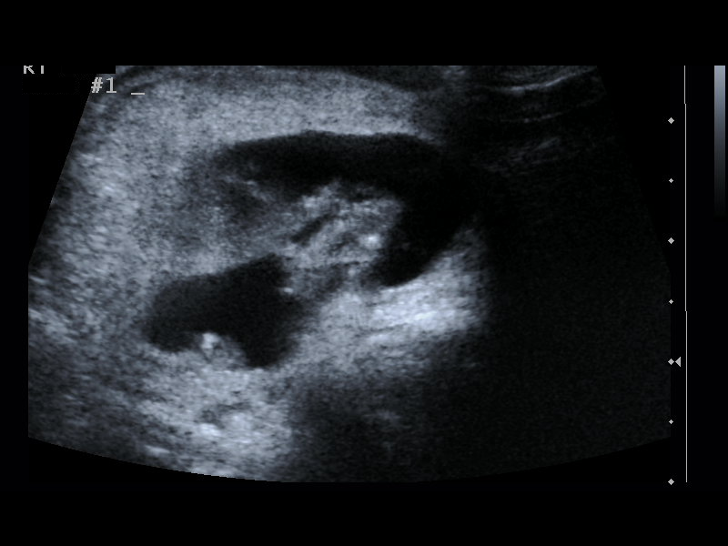
[im 6/14]
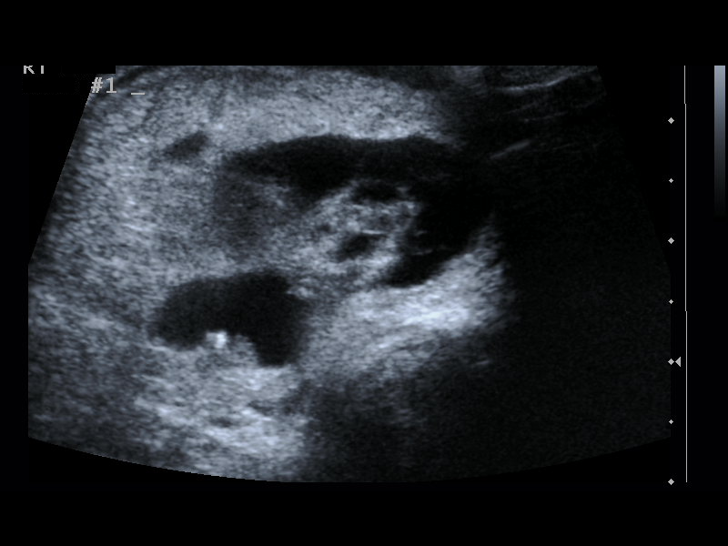
[im 8/14]
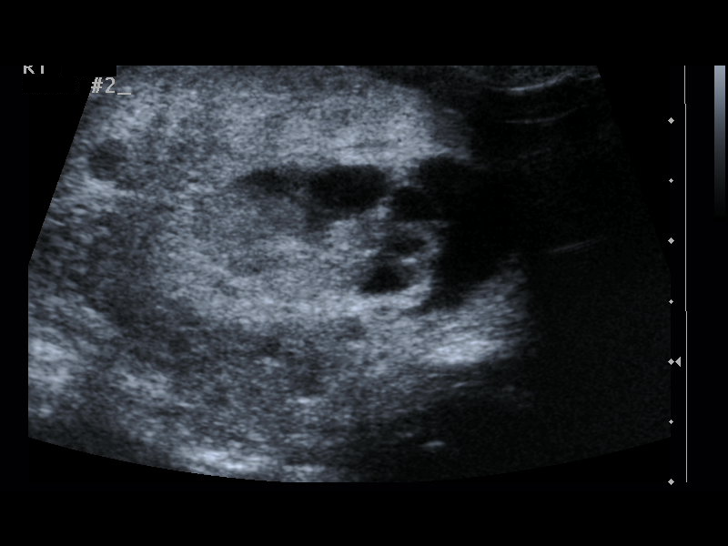
[im 9/14]
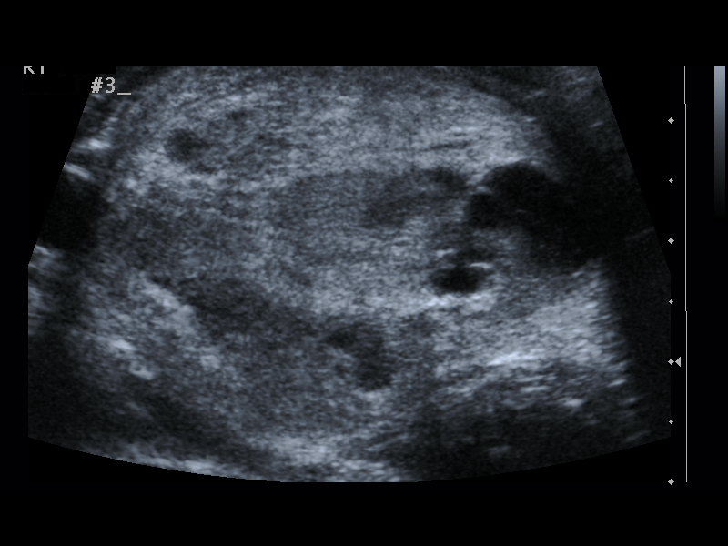
[im 10/14]
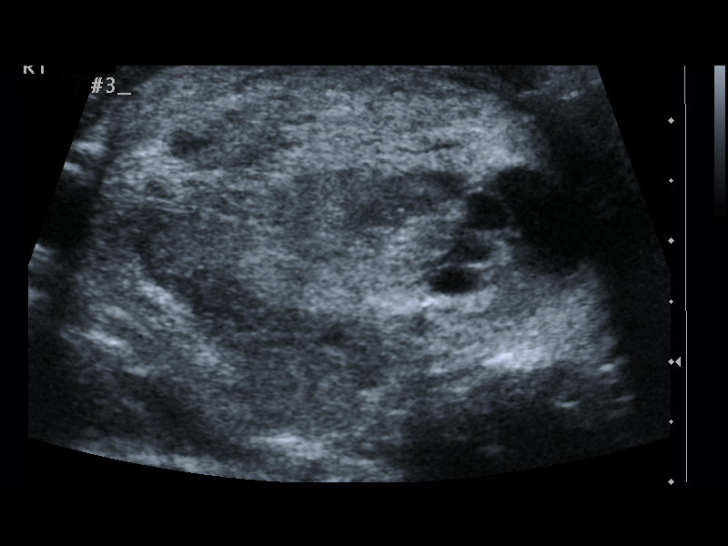
[im 11/14]
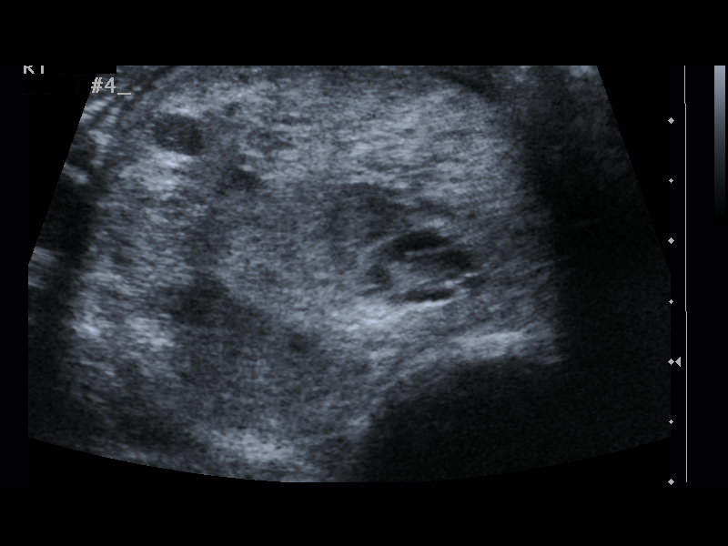
[im 12/14]
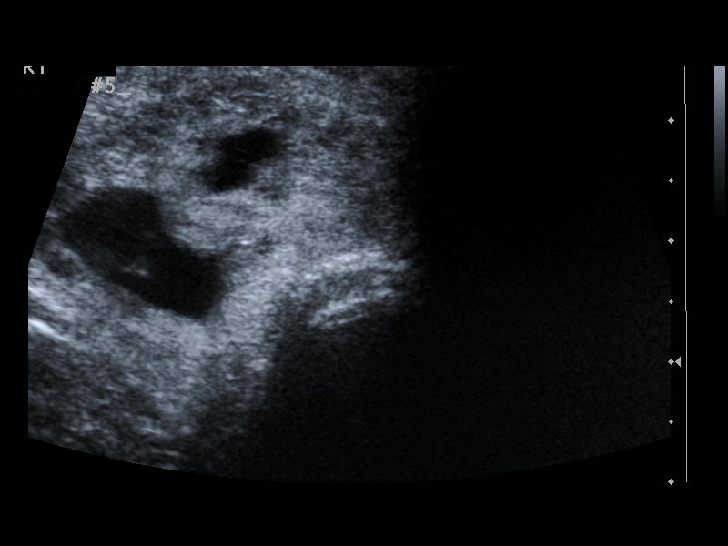
[im 13/14]
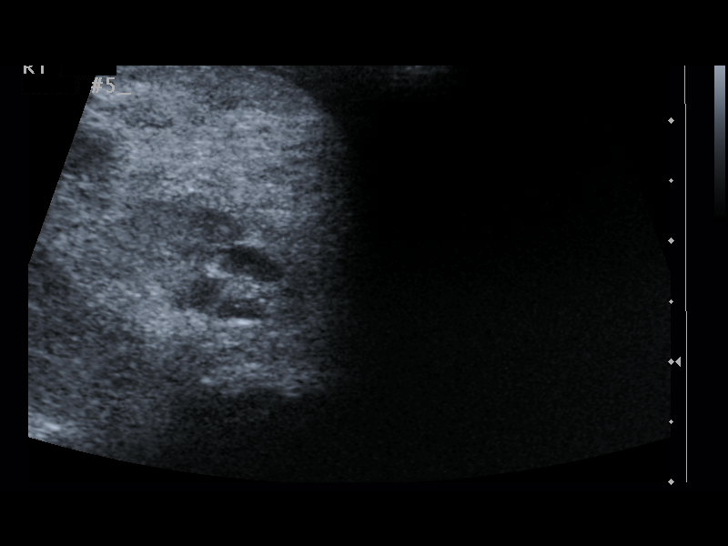
[im 14/14]
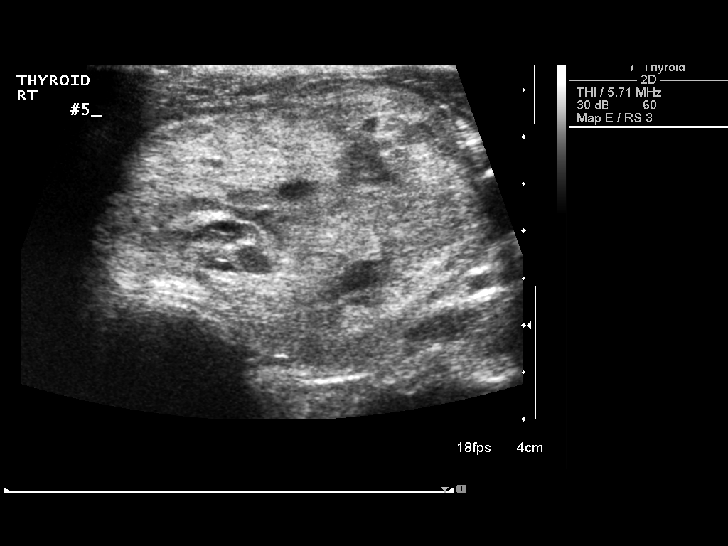

[13 of 14 positions shown; findings below may reference images not displayed]

[DATE] and
[DATE]

Thyroid biopsy was thoroughly discussed with the patient and
questions were answered.  The benefits, risks, alternatives, and
complications were also discussed.  The patient understands and
wishes to proceed with the procedure.  Written consent was
obtained.

Ultrasound was performed to localize and mark an adequate site for
the biopsy.  The patient was then prepped and draped in a normal
sterile fashion.  Local anesthesia was provided with 1% lidocaine.
Using direct ultrasound guidance, four passes were made using 25
gauge needles into the nodule within the right lobe of the thyroid.
Ultrasound was used to confirm needle placements on all occasions.
An 18 gauge needle was then carefully advanced into the largest
cystic component and this was successfully aspirated.  The
aspirated fluid was added to the cell block.  Specimens were sent
to Pathology for analysis.

Complications: None
FINDINGS: Technically successful fine needle aspiration biopsy and
partial cyst aspiration of large mixed cystic and solid thyroid
mass.
IMPRESSION: Ultrasound guided needle aspirate biopsy performed of the right
thyroid nodule.

[REDACTED]

## 2013-08-13 ENCOUNTER — Encounter: Payer: Self-pay | Admitting: Internal Medicine

## 2014-02-04 ENCOUNTER — Other Ambulatory Visit: Payer: Self-pay | Admitting: Internal Medicine

## 2014-02-04 DIAGNOSIS — E042 Nontoxic multinodular goiter: Secondary | ICD-10-CM

## 2014-02-10 ENCOUNTER — Other Ambulatory Visit: Payer: BC Managed Care – PPO

## 2014-02-19 ENCOUNTER — Encounter: Payer: Self-pay | Admitting: Internal Medicine

## 2014-02-24 ENCOUNTER — Ambulatory Visit
Admission: RE | Admit: 2014-02-24 | Discharge: 2014-02-24 | Disposition: A | Discharge status: Home / Self Care | Payer: BC Managed Care – PPO | Source: Ambulatory Visit | Attending: Internal Medicine | Admitting: Internal Medicine

## 2014-02-24 DIAGNOSIS — E042 Nontoxic multinodular goiter: Secondary | ICD-10-CM

## 2014-02-24 IMAGING — US US SOFT TISSUE HEAD/NECK
1 series · 14 of 25 positions shown · non-contrast
Comparison: [DATE]

CLINICAL DATA: Multinodular goiter.

EXAM:
THYROID ULTRASOUND
TECHNIQUE: Ultrasound examination of the thyroid gland and adjacent soft
tissues was performed.

[Series 1: us soft tissue head/neck · 0.13mm/px · 14 of 56 slices shown]
[im 1/56]
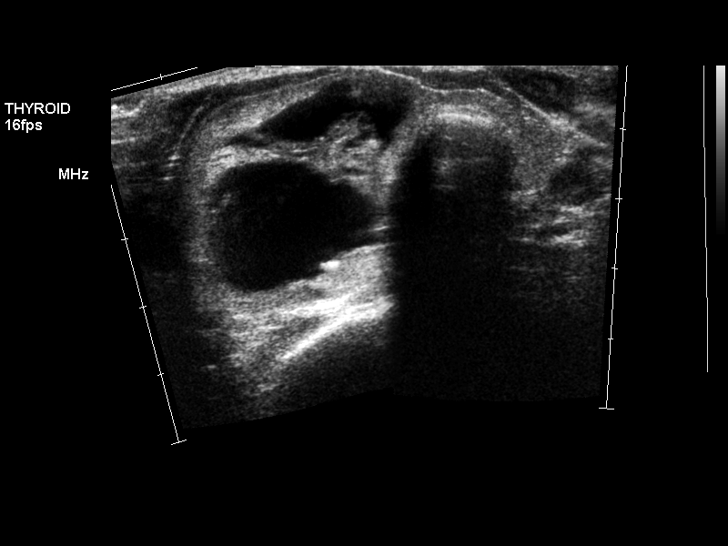
[im 5/56]
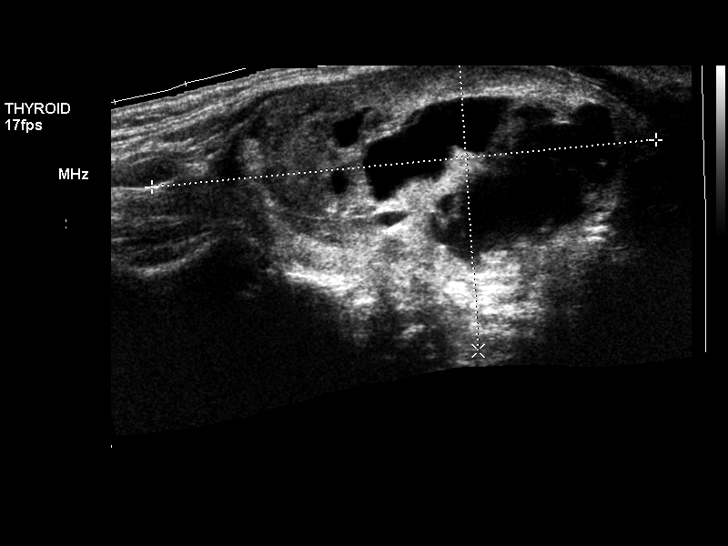
[im 10/56]
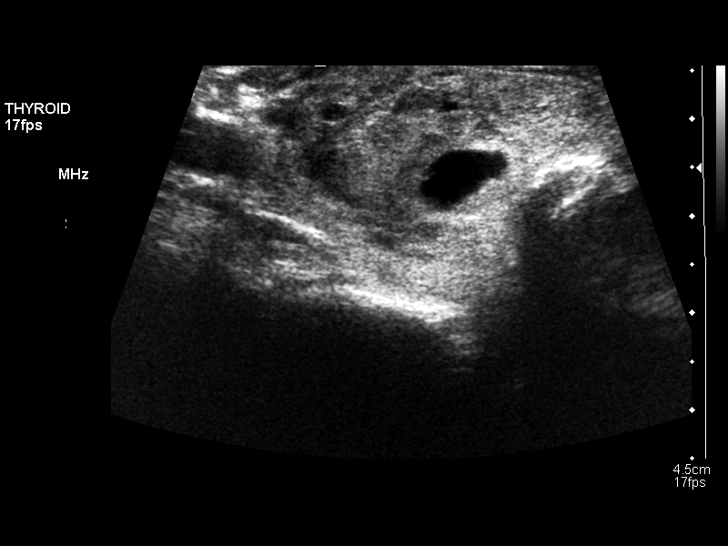
[im 14/56]
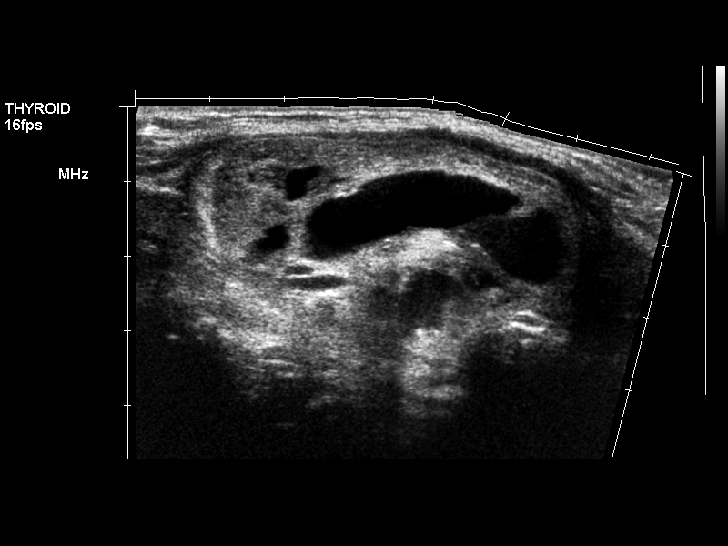
[im 19/56]
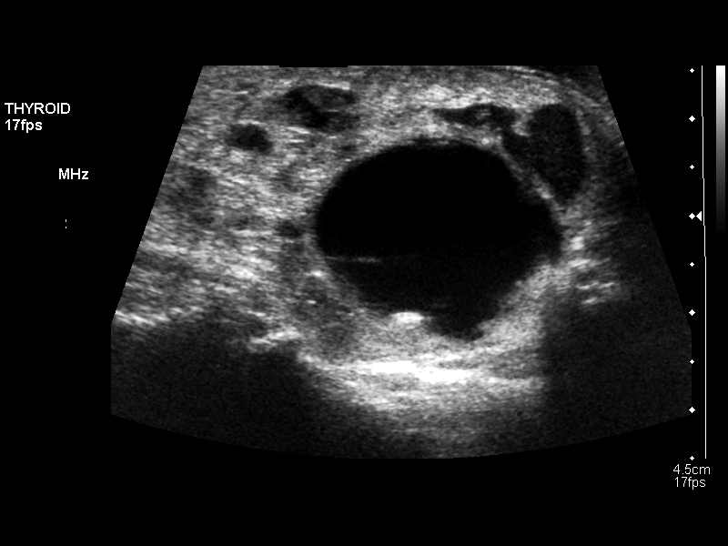
[im 21/56]
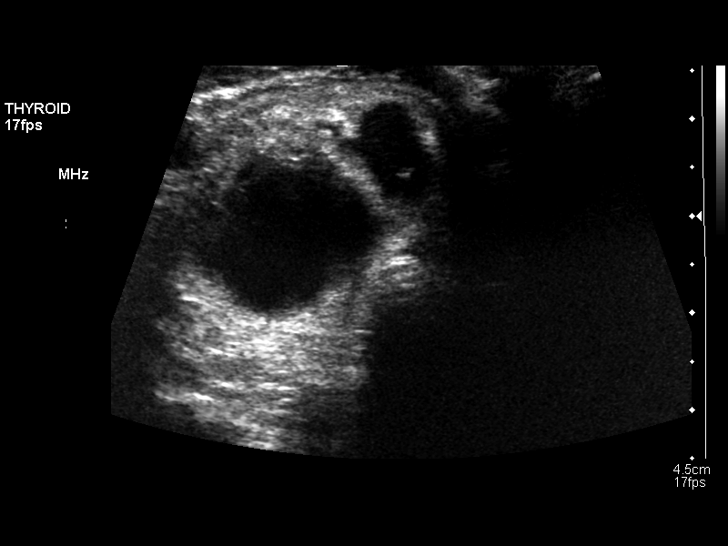
[im 26/56]
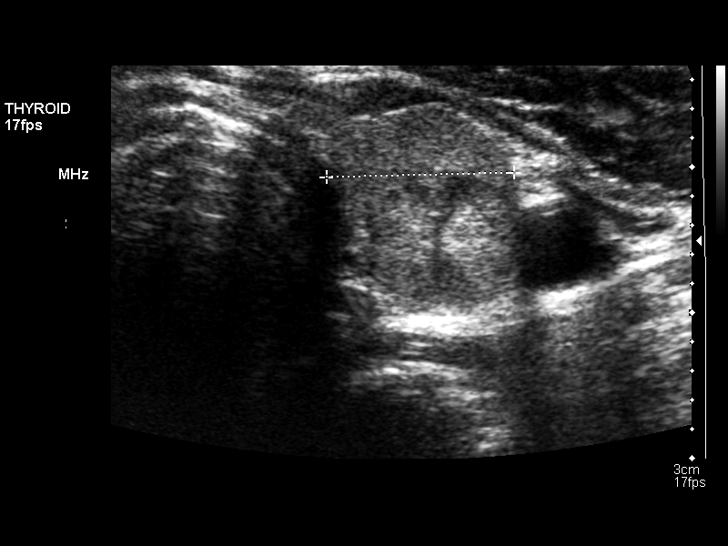
[im 30/56]
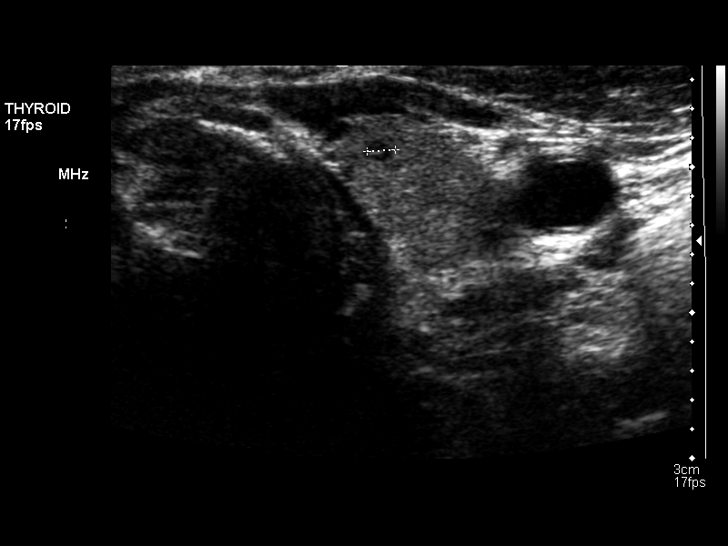
[im 35/56]
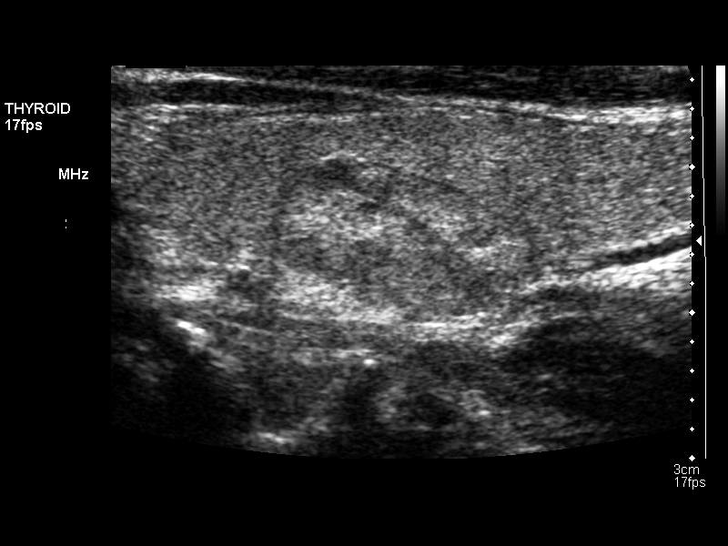
[im 37/56]
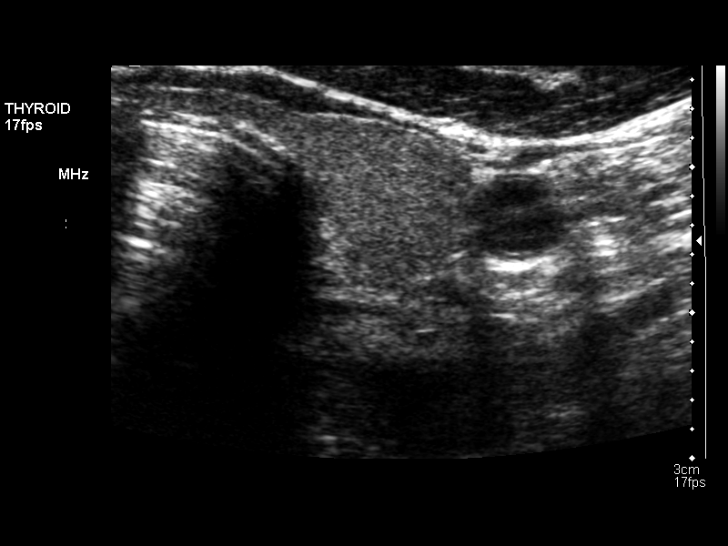
[im 42/56]
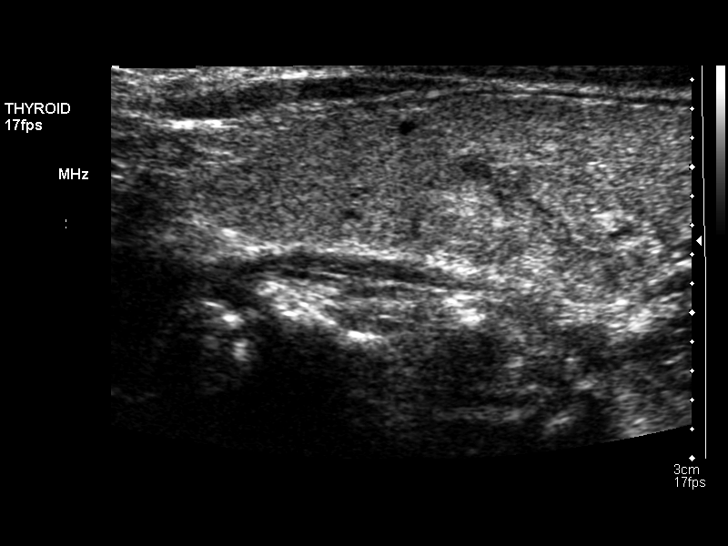
[im 46/56]
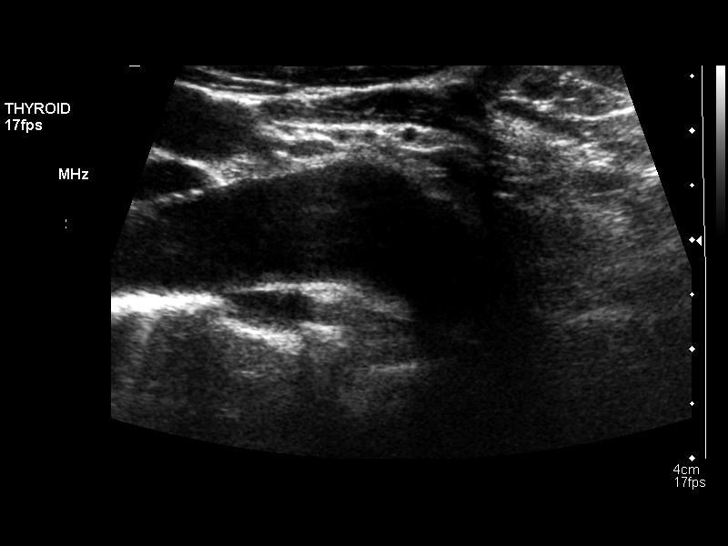
[im 51/56]
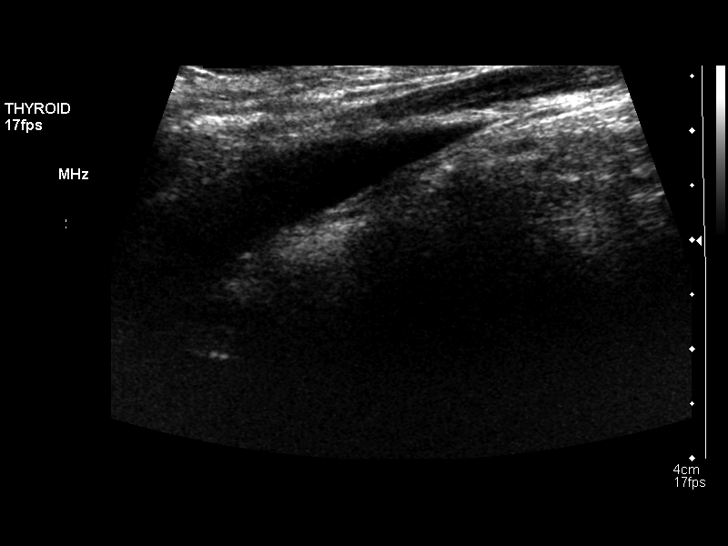
[im 56/56]
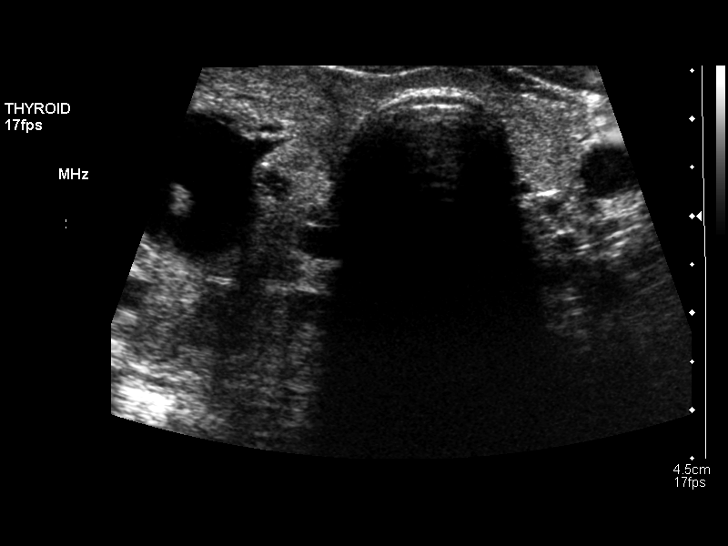

[14 of 25 positions shown; findings below may reference images not displayed]

FINDINGS: Right thyroid lobe

Measurements: 6.9 x 4.0 x 3.7 cm. Right thyroid tissue is replaced
by a large complex nodule with cystic components. This nodule
measures 4.7 x 3.2 x 5.6 cm and previously measured 5.1 x 2.9 x
cm. Morphology of this nodule has not significantly changed.

Left thyroid lobe

Measurements: 5.1 x 1.2 x 1.3 cm. Again noted is a hypervascular and
isoechoic nodule in the posterior left thyroid lobe that measures
1.8 x 1.3 x 1.0 cm and previously measured 1.8 x 1.0 x 1.1 cm. There
is a punctate hypoechoic structure in the inferior left thyroid lobe
that measures 0.2 cm and could represent a small cyst. 0.2 cm nodule
in the superior left thyroid lobe.

Isthmus

Thickness: 0.3 cm.  No nodules visualized.

Lymphadenopathy

None visualized.
IMPRESSION: Mild enlargement of the dominant complex nodule in the right thyroid
lobe.

Stable solid nodule in the left thyroid lobe.

## 2014-09-07 DIAGNOSIS — M533 Sacrococcygeal disorders, not elsewhere classified: Secondary | ICD-10-CM | POA: Diagnosis not present

## 2014-10-13 DIAGNOSIS — Z803 Family history of malignant neoplasm of breast: Secondary | ICD-10-CM | POA: Diagnosis not present

## 2014-10-13 DIAGNOSIS — Z1231 Encounter for screening mammogram for malignant neoplasm of breast: Secondary | ICD-10-CM | POA: Diagnosis not present

## 2014-10-13 DIAGNOSIS — M810 Age-related osteoporosis without current pathological fracture: Secondary | ICD-10-CM | POA: Diagnosis not present

## 2014-10-29 DIAGNOSIS — Z23 Encounter for immunization: Secondary | ICD-10-CM | POA: Diagnosis not present

## 2014-10-29 DIAGNOSIS — M549 Dorsalgia, unspecified: Secondary | ICD-10-CM | POA: Diagnosis not present

## 2014-10-29 DIAGNOSIS — E042 Nontoxic multinodular goiter: Secondary | ICD-10-CM | POA: Diagnosis not present

## 2014-10-29 DIAGNOSIS — M810 Age-related osteoporosis without current pathological fracture: Secondary | ICD-10-CM | POA: Diagnosis not present

## 2015-01-12 DIAGNOSIS — Z01419 Encounter for gynecological examination (general) (routine) without abnormal findings: Secondary | ICD-10-CM | POA: Diagnosis not present

## 2015-01-12 DIAGNOSIS — M810 Age-related osteoporosis without current pathological fracture: Secondary | ICD-10-CM | POA: Diagnosis not present

## 2015-01-12 DIAGNOSIS — Z124 Encounter for screening for malignant neoplasm of cervix: Secondary | ICD-10-CM | POA: Diagnosis not present

## 2015-01-15 DIAGNOSIS — E559 Vitamin D deficiency, unspecified: Secondary | ICD-10-CM | POA: Diagnosis not present

## 2015-02-10 ENCOUNTER — Other Ambulatory Visit: Payer: Self-pay | Admitting: Internal Medicine

## 2015-02-10 DIAGNOSIS — E049 Nontoxic goiter, unspecified: Secondary | ICD-10-CM

## 2015-02-16 ENCOUNTER — Other Ambulatory Visit: Payer: Self-pay

## 2015-02-24 ENCOUNTER — Other Ambulatory Visit: Payer: Self-pay

## 2015-03-09 ENCOUNTER — Ambulatory Visit
Admission: RE | Admit: 2015-03-09 | Discharge: 2015-03-09 | Disposition: A | Discharge status: Home / Self Care | Payer: Medicare Other | Source: Ambulatory Visit | Attending: Internal Medicine | Admitting: Internal Medicine

## 2015-03-09 ENCOUNTER — Other Ambulatory Visit: Payer: Self-pay

## 2015-03-09 ENCOUNTER — Encounter (INDEPENDENT_AMBULATORY_CARE_PROVIDER_SITE_OTHER): Payer: Self-pay

## 2015-03-09 DIAGNOSIS — E042 Nontoxic multinodular goiter: Secondary | ICD-10-CM | POA: Diagnosis not present

## 2015-03-09 DIAGNOSIS — E049 Nontoxic goiter, unspecified: Secondary | ICD-10-CM

## 2015-03-09 IMAGING — US US SOFT TISSUE HEAD/NECK
1 series · 13 of 25 positions shown · non-contrast
Comparison: [DATE], [DATE], [DATE] and [DATE]

CLINICAL DATA: Multinodular thyroid goiter with prior bilateral
thyroid nodule biopsy on [DATE] and dominant right nodule biopsy
on [DATE].

EXAM:
THYROID ULTRASOUND
TECHNIQUE: Ultrasound examination of the thyroid gland and adjacent soft
tissues was performed.

[Series 1: us soft tissue head/neck · 0.06mm/px · 13 of 50 slices shown]
[im 1/50]
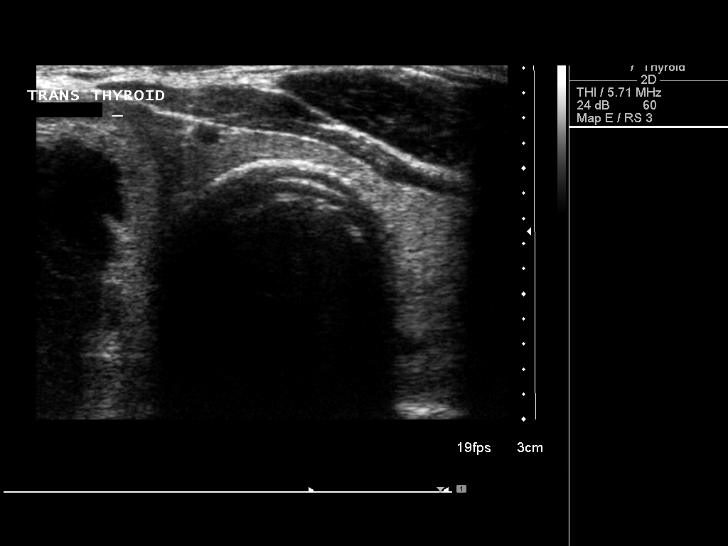
[im 5/50]
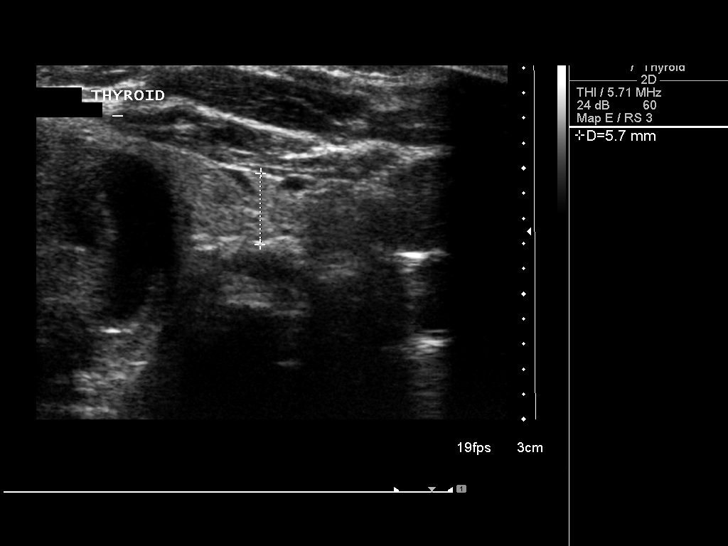
[im 9/50]
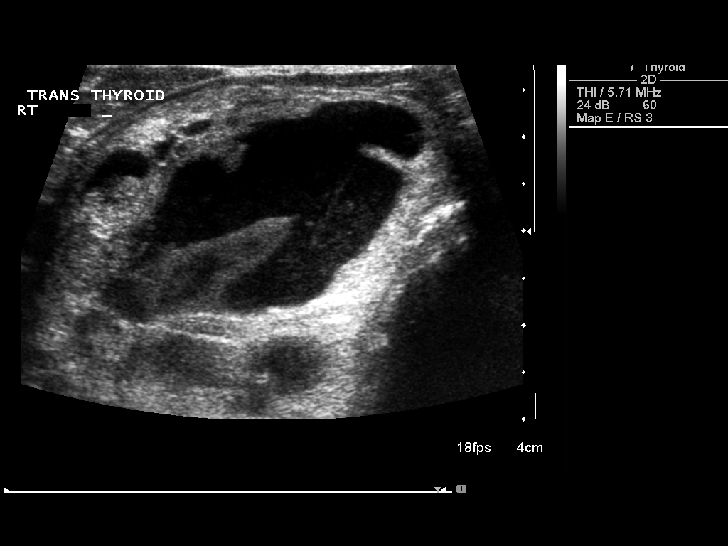
[im 13/50]
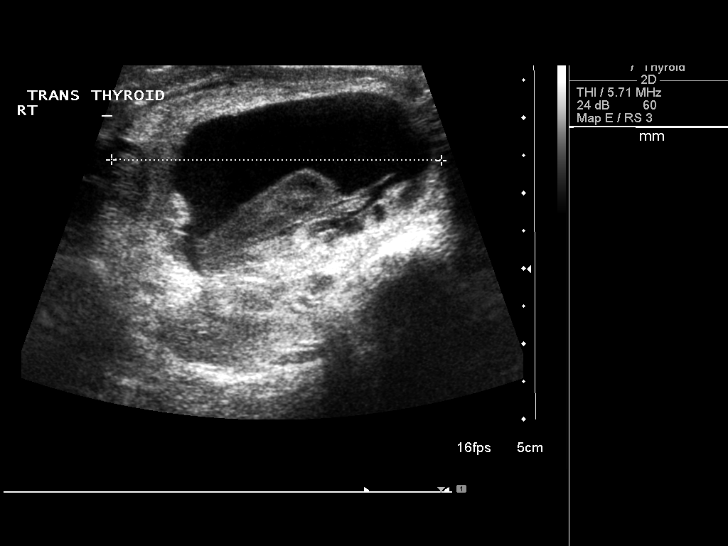
[im 17/50]
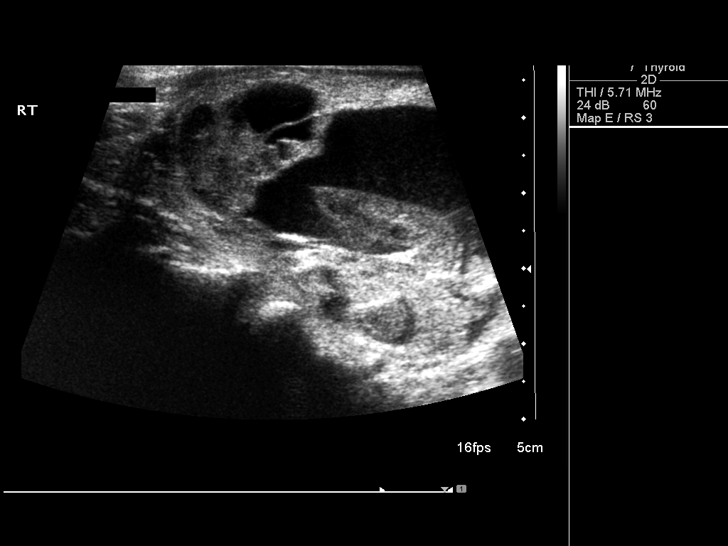
[im 21/50]
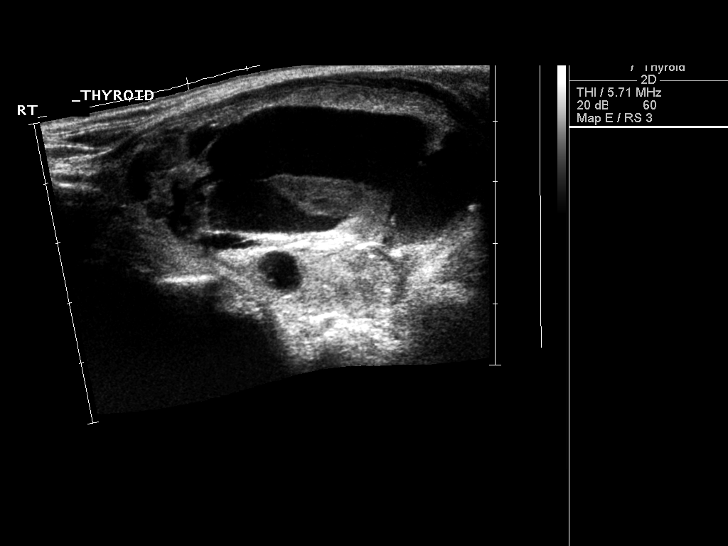
[im 25/50]
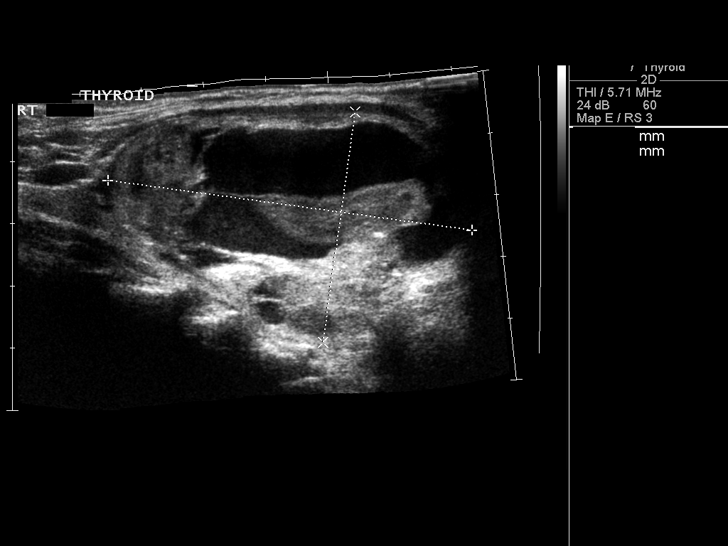
[im 29/50]
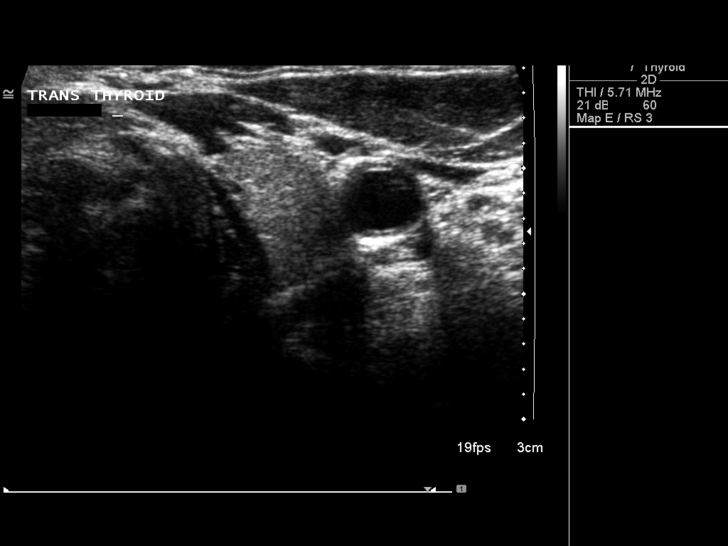
[im 33/50]
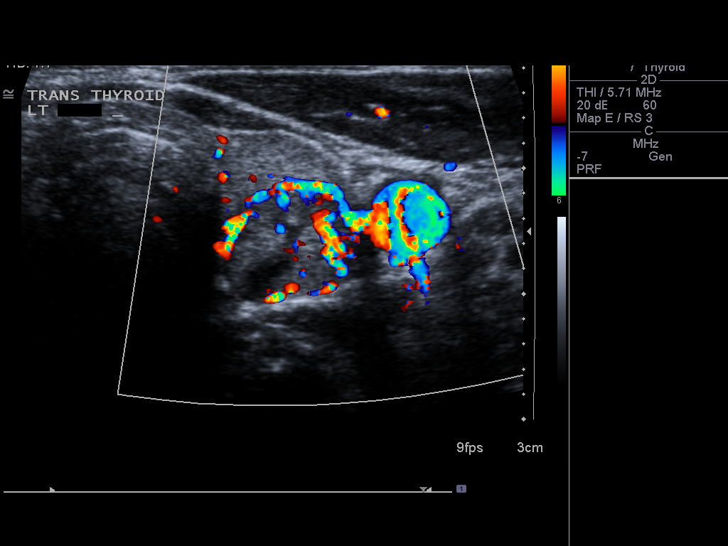
[im 37/50]
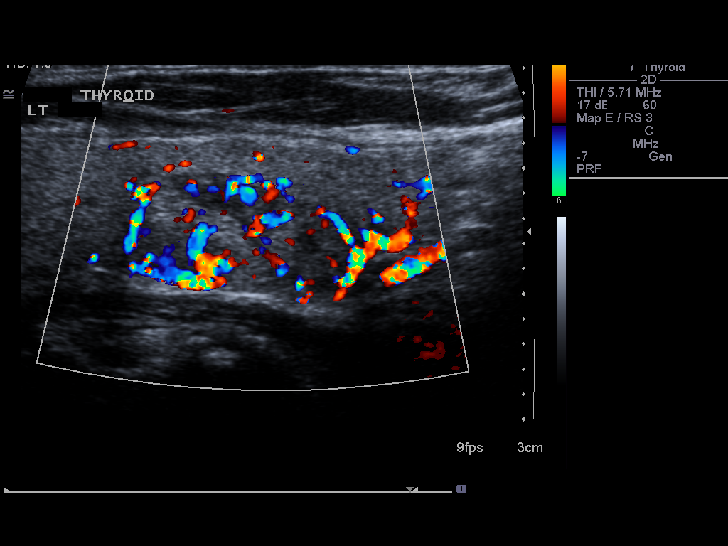
[im 41/50]
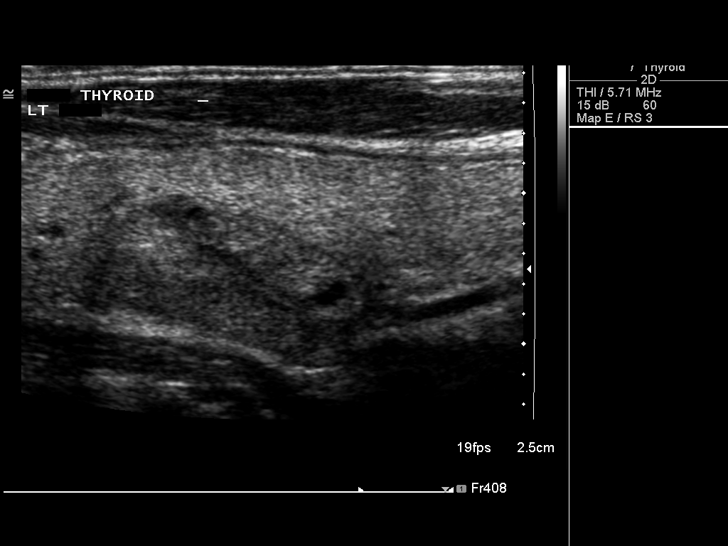
[im 45/50]
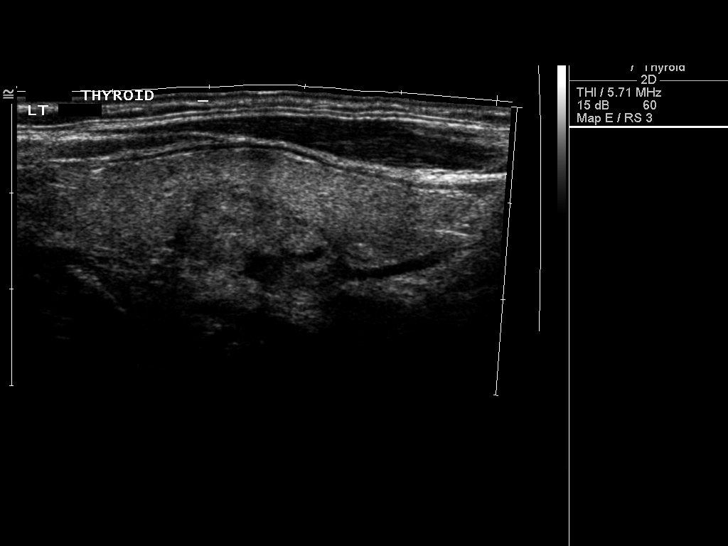
[im 50/50]
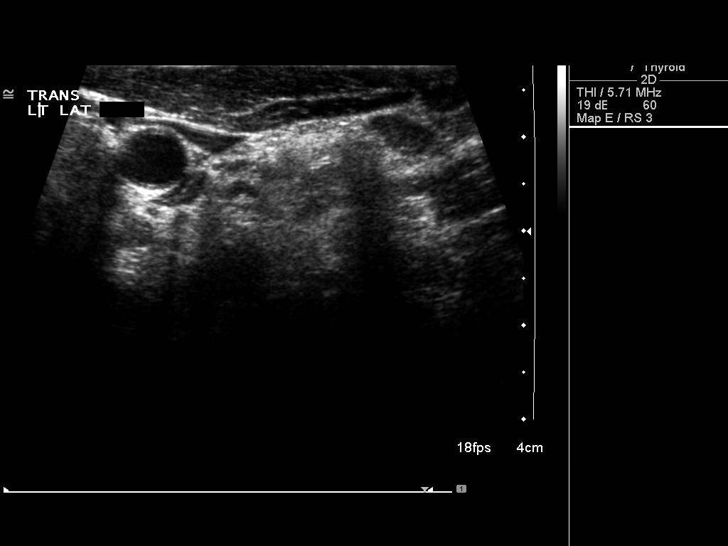

[13 of 25 positions shown; findings below may reference images not displayed]

FINDINGS: Right thyroid lobe

Measurements: 6.7 x 3.9 x 4.4 cm (stable size). The dominant,
predominately cystic and partially solid complex nodule in the right
lobe shows some more prominent central cystic degeneration with
mixed echogenicity fluid present suggestive of potentially internal
hemorrhage. Nodule dimensions are approximately 6.0 x 3.7 x 4.4 cm
(previously 5.6 x 3.2 x 4.7 cm). The slight enlargement is felt to
be secondary to the more prominent cystic degeneration and potential
hemorrhage.

Left thyroid lobe

Measurements: 5.0 x 1.6 x 1.2 cm (stable size). Stable solid nodule
in the mid left lobe measures approximately 1.9 x 0.9 x 1.1 cm and
shows stable morphology.

Isthmus

Thickness: 0.6 cm.  No nodules visualized.

Lymphadenopathy

None visualized.
IMPRESSION: 1. Slight interval enlargement of the dominant predominately cystic
and partially solid complex nodule in the right lobe. This is felt
likely due to further cystic degeneration with mixed echogenicity
fluid likely representing some partial hemorrhage into the cystic
portion of the lesion.
2. Stable left thyroid nodule.

## 2015-03-22 DIAGNOSIS — E042 Nontoxic multinodular goiter: Secondary | ICD-10-CM | POA: Diagnosis not present

## 2015-05-17 ENCOUNTER — Other Ambulatory Visit: Payer: Self-pay | Admitting: Family Medicine

## 2015-05-17 ENCOUNTER — Ambulatory Visit
Admission: RE | Admit: 2015-05-17 | Discharge: 2015-05-17 | Disposition: A | Discharge status: Home / Self Care | Payer: Medicare Other | Source: Ambulatory Visit | Attending: Family Medicine | Admitting: Family Medicine

## 2015-05-17 DIAGNOSIS — M5136 Other intervertebral disc degeneration, lumbar region: Secondary | ICD-10-CM | POA: Diagnosis not present

## 2015-05-17 DIAGNOSIS — M545 Low back pain: Secondary | ICD-10-CM

## 2015-05-17 DIAGNOSIS — Z23 Encounter for immunization: Secondary | ICD-10-CM | POA: Diagnosis not present

## 2015-05-17 DIAGNOSIS — M549 Dorsalgia, unspecified: Secondary | ICD-10-CM | POA: Diagnosis not present

## 2015-05-17 DIAGNOSIS — M79606 Pain in leg, unspecified: Secondary | ICD-10-CM | POA: Diagnosis not present

## 2015-05-17 IMAGING — CR DG LUMBAR SPINE 2-3V
4 series · 4 of 4 positions shown · non-contrast
Comparison: None

CLINICAL DATA: Chronic low back pain with radiation to both legs,
no injury

EXAM:
LUMBAR SPINE - 2-3 VIEW

[t l-spine a.p.]
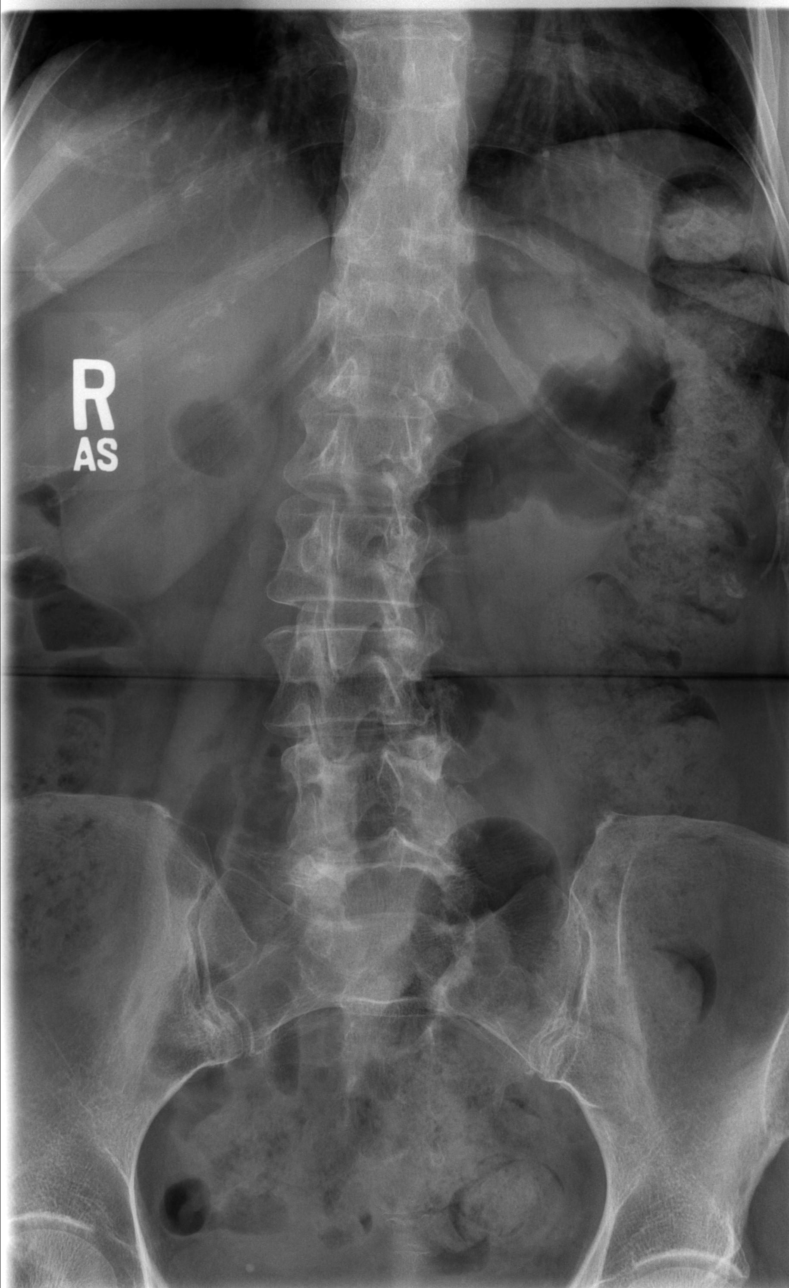

[t l-spine lat]
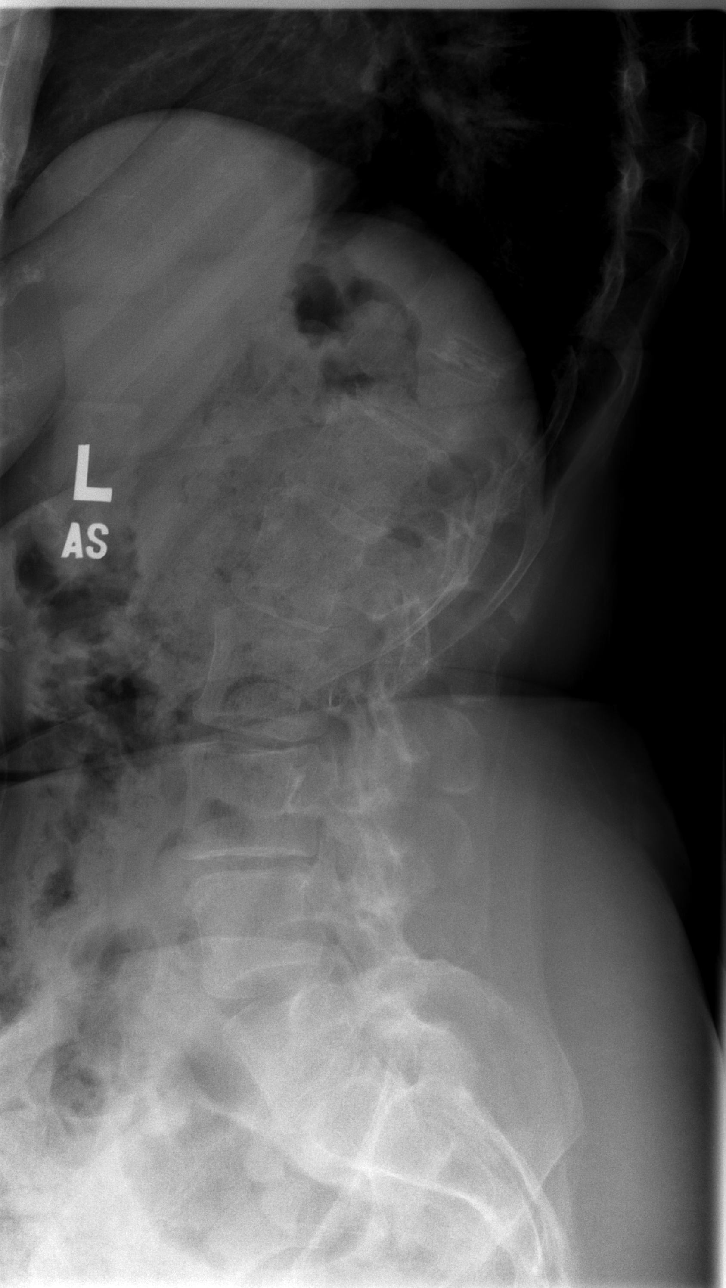

[t l-spine l5-s1 spot]
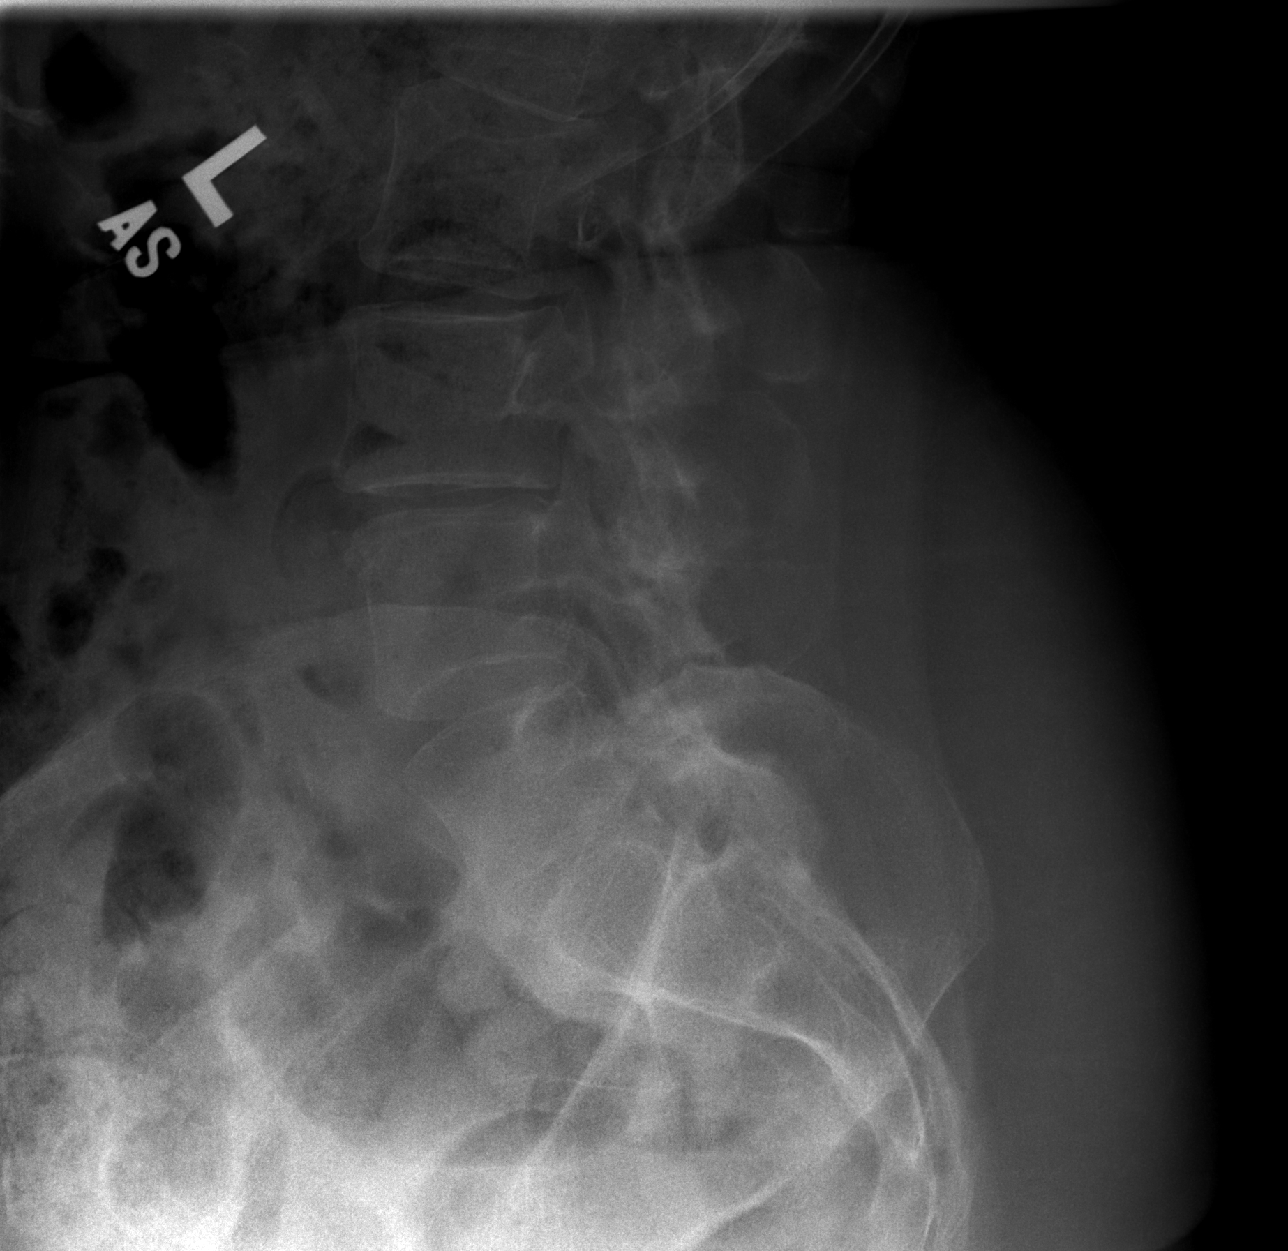

[t l-spine lat *]
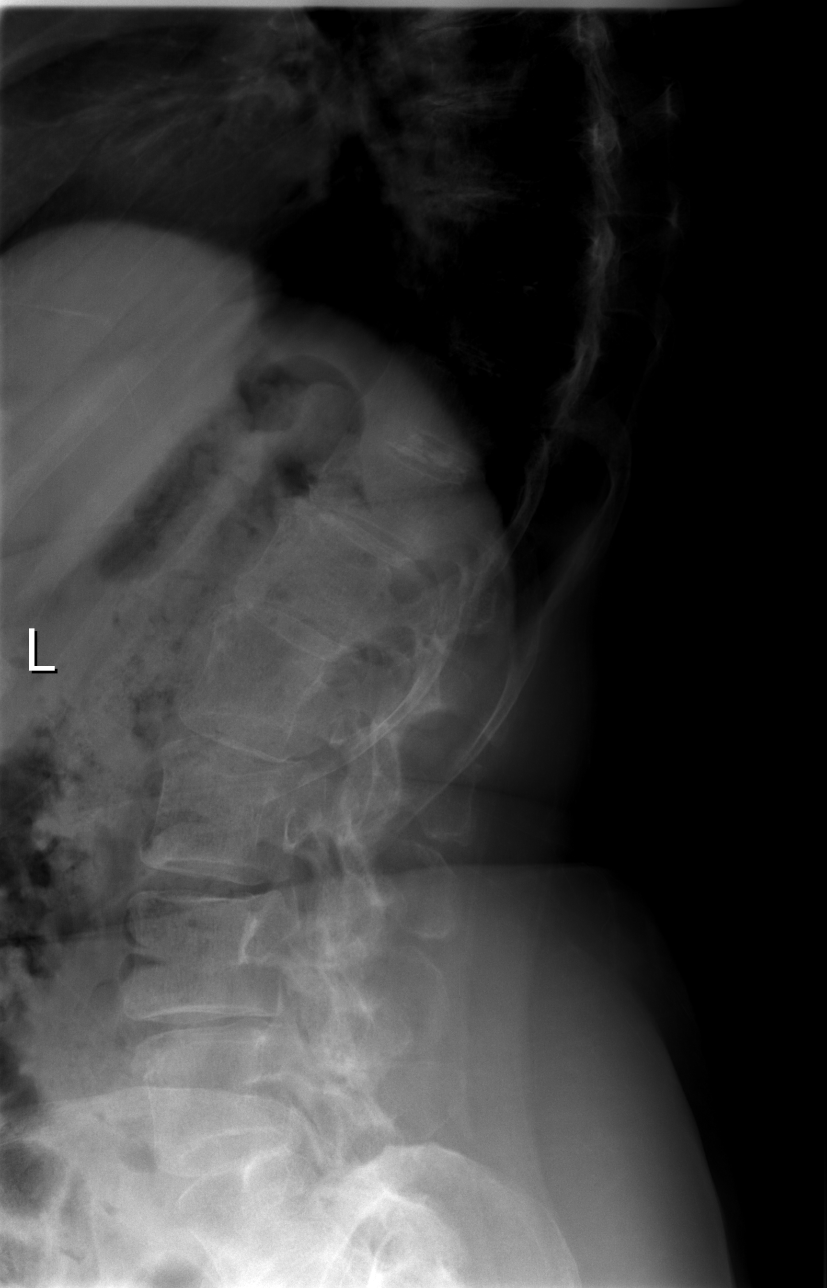

[4 of 4 positions shown; findings below may reference images not displayed]

FINDINGS: There is lumbar curvature convex to the right by approximately 21
degrees. No significant degenerative disc disease is seen although
the L3-4 and L4-5 disc spaces appear slightly narrowed. There is
some degenerative change involving the facet joints of L5-S1. No
compression deformity is seen. Calcified discs are noted in the
lower thoracic spine.
IMPRESSION: Mild curvature of the lumbar spine convex to the right by 21
degrees. Mild degenerative disc disease at L3-4 and L4-5.

## 2015-05-31 DIAGNOSIS — M79606 Pain in leg, unspecified: Secondary | ICD-10-CM | POA: Diagnosis not present

## 2015-05-31 DIAGNOSIS — M545 Low back pain: Secondary | ICD-10-CM | POA: Diagnosis not present

## 2015-05-31 DIAGNOSIS — M549 Dorsalgia, unspecified: Secondary | ICD-10-CM | POA: Diagnosis not present

## 2015-06-07 DIAGNOSIS — M545 Low back pain: Secondary | ICD-10-CM | POA: Diagnosis not present

## 2015-06-07 DIAGNOSIS — M549 Dorsalgia, unspecified: Secondary | ICD-10-CM | POA: Diagnosis not present

## 2015-06-07 DIAGNOSIS — M79606 Pain in leg, unspecified: Secondary | ICD-10-CM | POA: Diagnosis not present

## 2015-06-09 DIAGNOSIS — M545 Low back pain: Secondary | ICD-10-CM | POA: Diagnosis not present

## 2015-06-09 DIAGNOSIS — M79606 Pain in leg, unspecified: Secondary | ICD-10-CM | POA: Diagnosis not present

## 2015-06-09 DIAGNOSIS — M549 Dorsalgia, unspecified: Secondary | ICD-10-CM | POA: Diagnosis not present

## 2015-06-16 DIAGNOSIS — M545 Low back pain: Secondary | ICD-10-CM | POA: Diagnosis not present

## 2015-06-16 DIAGNOSIS — M549 Dorsalgia, unspecified: Secondary | ICD-10-CM | POA: Diagnosis not present

## 2015-06-16 DIAGNOSIS — M79606 Pain in leg, unspecified: Secondary | ICD-10-CM | POA: Diagnosis not present

## 2015-06-21 DIAGNOSIS — M549 Dorsalgia, unspecified: Secondary | ICD-10-CM | POA: Diagnosis not present

## 2015-06-21 DIAGNOSIS — M545 Low back pain: Secondary | ICD-10-CM | POA: Diagnosis not present

## 2015-06-21 DIAGNOSIS — M79606 Pain in leg, unspecified: Secondary | ICD-10-CM | POA: Diagnosis not present

## 2015-06-23 DIAGNOSIS — M545 Low back pain: Secondary | ICD-10-CM | POA: Diagnosis not present

## 2015-06-23 DIAGNOSIS — M79606 Pain in leg, unspecified: Secondary | ICD-10-CM | POA: Diagnosis not present

## 2015-06-23 DIAGNOSIS — M549 Dorsalgia, unspecified: Secondary | ICD-10-CM | POA: Diagnosis not present

## 2015-07-05 DIAGNOSIS — M79606 Pain in leg, unspecified: Secondary | ICD-10-CM | POA: Diagnosis not present

## 2015-07-05 DIAGNOSIS — M545 Low back pain: Secondary | ICD-10-CM | POA: Diagnosis not present

## 2015-07-05 DIAGNOSIS — M549 Dorsalgia, unspecified: Secondary | ICD-10-CM | POA: Diagnosis not present

## 2015-10-22 DIAGNOSIS — Z1231 Encounter for screening mammogram for malignant neoplasm of breast: Secondary | ICD-10-CM | POA: Diagnosis not present

## 2015-10-26 DIAGNOSIS — B009 Herpesviral infection, unspecified: Secondary | ICD-10-CM | POA: Diagnosis not present

## 2015-10-26 DIAGNOSIS — Z23 Encounter for immunization: Secondary | ICD-10-CM | POA: Diagnosis not present

## 2015-10-26 DIAGNOSIS — L918 Other hypertrophic disorders of the skin: Secondary | ICD-10-CM | POA: Diagnosis not present

## 2015-10-26 DIAGNOSIS — L821 Other seborrheic keratosis: Secondary | ICD-10-CM | POA: Diagnosis not present

## 2016-03-01 ENCOUNTER — Other Ambulatory Visit: Payer: Self-pay | Admitting: Internal Medicine

## 2016-03-01 DIAGNOSIS — E042 Nontoxic multinodular goiter: Secondary | ICD-10-CM

## 2016-03-17 ENCOUNTER — Ambulatory Visit
Admission: RE | Admit: 2016-03-17 | Discharge: 2016-03-17 | Disposition: A | Discharge status: Home / Self Care | Payer: Medicare Other | Source: Ambulatory Visit | Attending: Internal Medicine | Admitting: Internal Medicine

## 2016-03-17 DIAGNOSIS — E042 Nontoxic multinodular goiter: Secondary | ICD-10-CM | POA: Diagnosis not present

## 2016-03-17 IMAGING — US US THYROID
1 series · 13 of 25 positions shown · non-contrast
Comparison: Thyroid ultrasound - [DATE]; [DATE];
[DATE]; ultrasound-guided right-sided thyroid nodule biopsy
-[DATE]

CLINICAL DATA: Multi nodular goiter

EXAM:
THYROID ULTRASOUND
TECHNIQUE: Ultrasound examination of the thyroid gland and adjacent soft
tissues was performed.

[Series 1: us thyroid · 0.06mm/px · 13 of 38 slices shown]
[im 1/38]
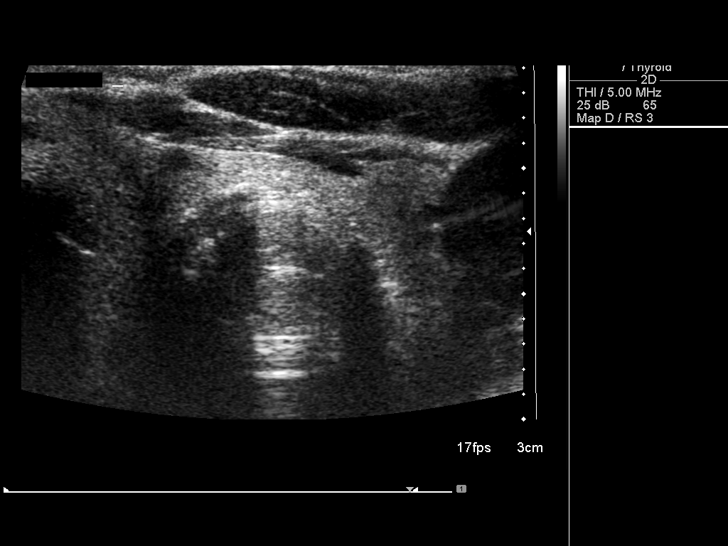
[im 4/38]
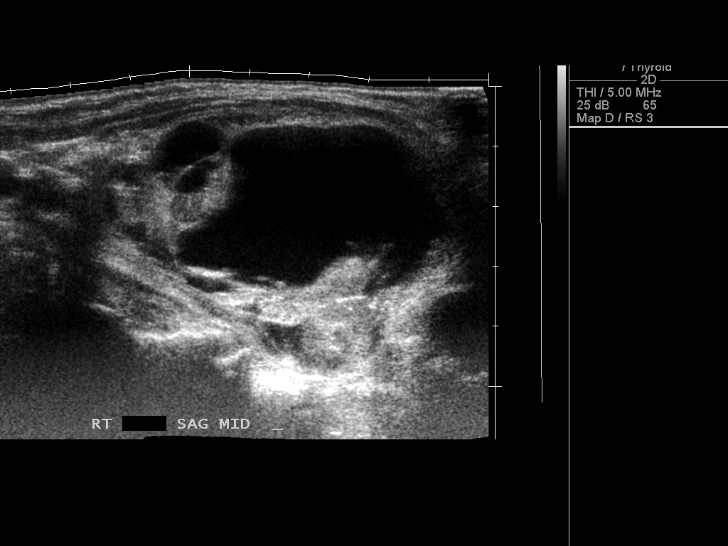
[im 7/38]
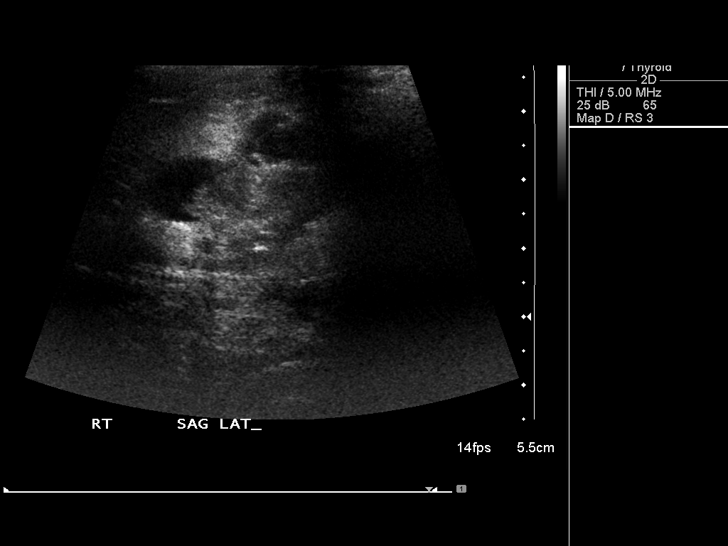
[im 10/38]
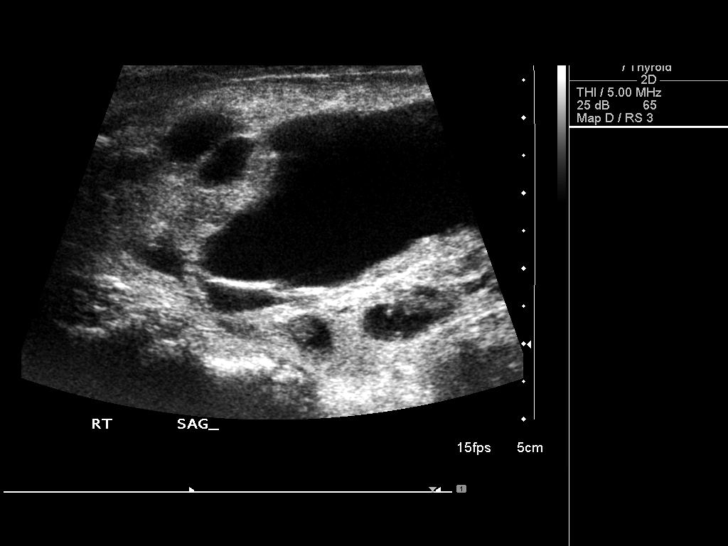
[im 13/38]
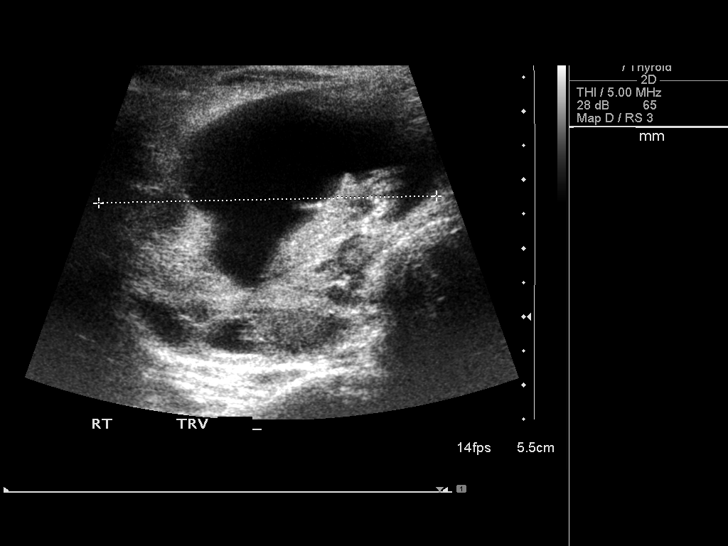
[im 16/38]
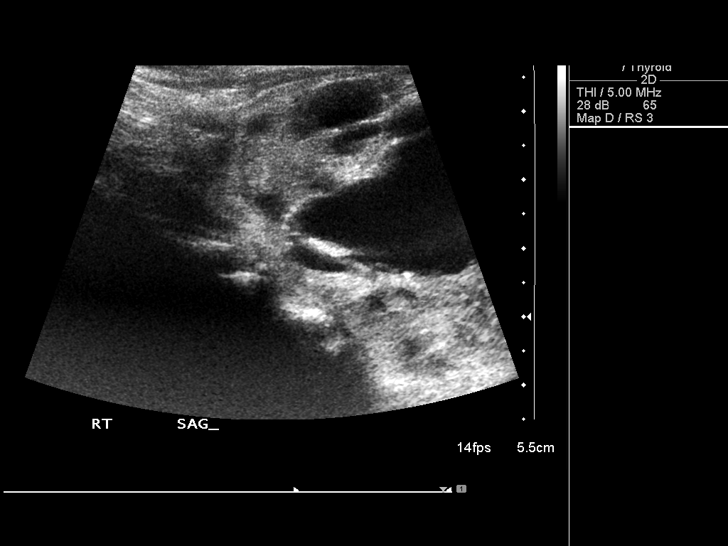
[im 19/38]
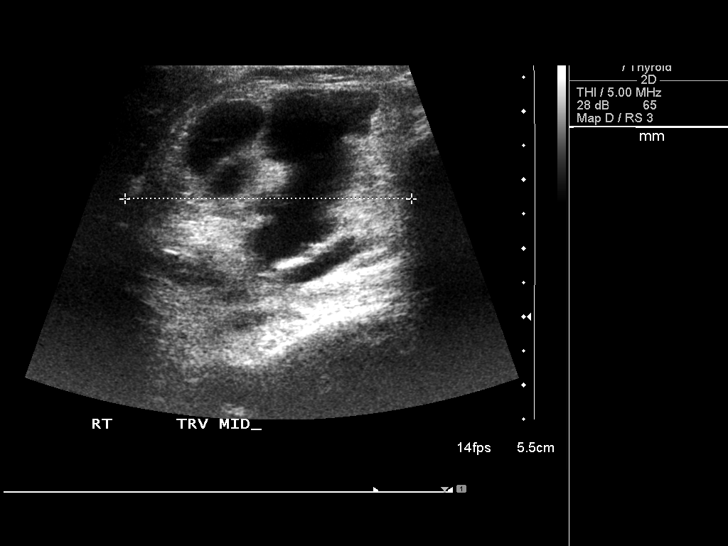
[im 22/38]
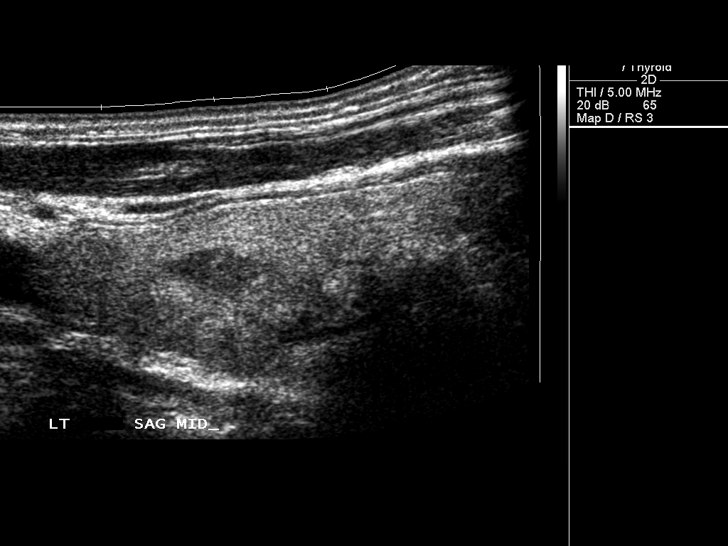
[im 25/38]
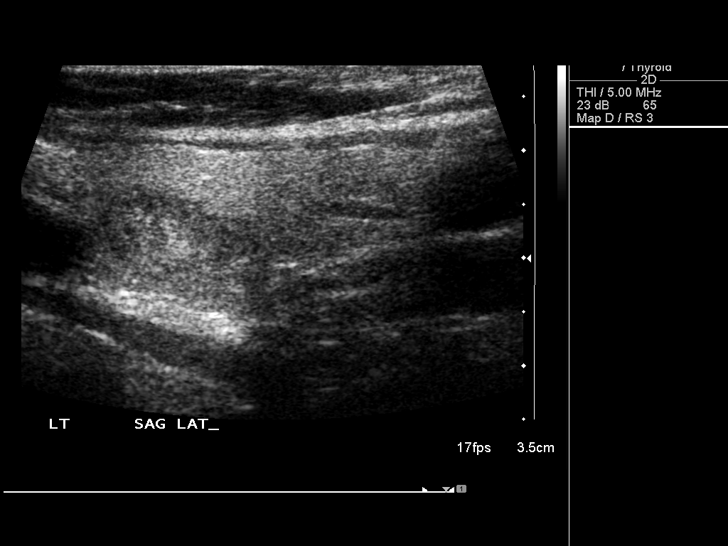
[im 28/38]
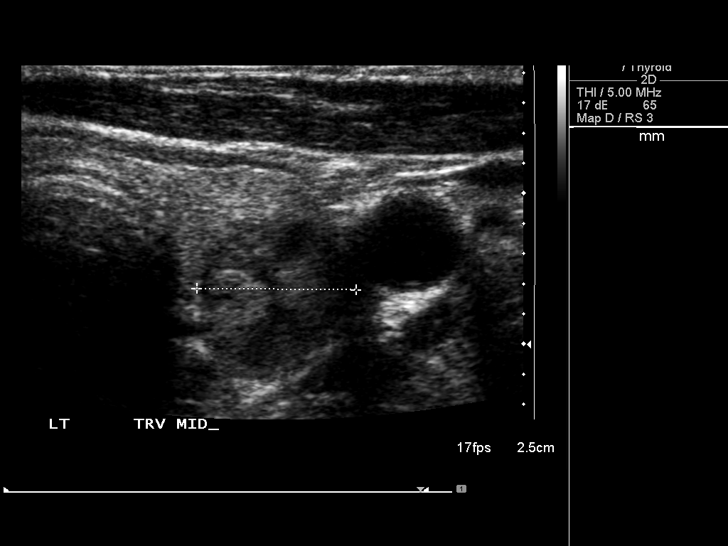
[im 31/38]
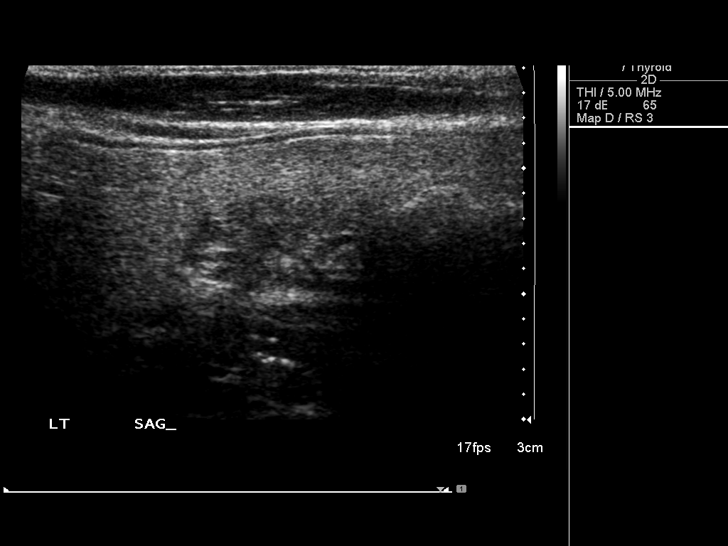
[im 34/38]
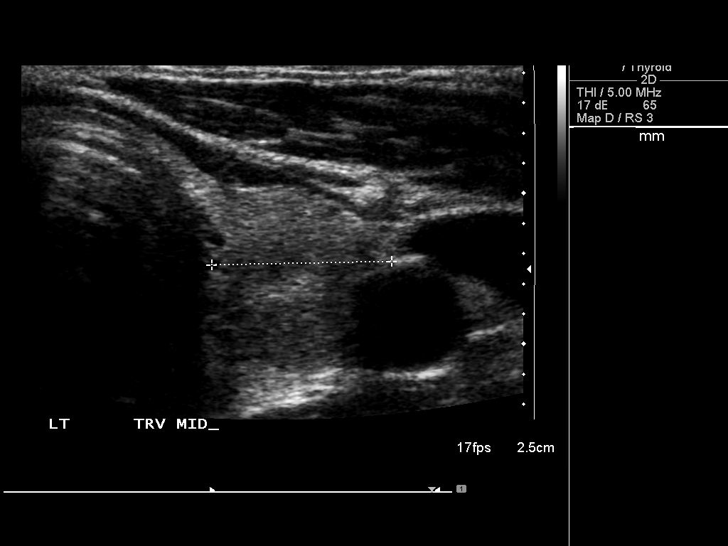
[im 38/38]
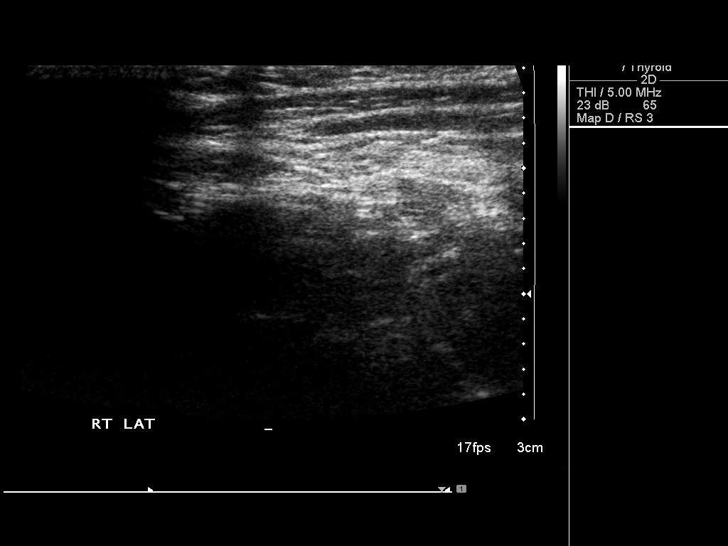

[13 of 25 positions shown; findings below may reference images not displayed]

FINDINGS: Parenchymal Echotexture: Mildly heterogenous

Isthmus: Normal in size measures 0.3 cm in diameter, unchanged

No discrete nodules are identified within the thyroid isthmus.

Right lobe: Enlarged measuring 7.1 x 4.3 x 4.2 cm, grossly
unchanged, previously, 6.7 x 3.9 x 4.4 cm with slight differences
likely attributable to scan plane projection.

Right, mid - 5.9 x 3.7 x 4.9 cm - partially solid, predominantly
cystic - grossly unchanged since the [DATE] examination,
previously, 6.0 x 3.8 x 4.4 cm again with slight differences likely
attributable to scan plane projection - this nodule was previously
biopsied.

Left lobe: Normal in size measuring 4.5 x 1.7 x 1.2 cm, unchanged,
previously, 5.0 x 1.6 x 1.2 cm

Left, mid, posterior - 1.8 x 1.1 x 1.1 cm - mixed echogenic, solid -
unchanged compared to the [DATE] examination, previously, 1.8 x
1.1 x 1.0 cm.
IMPRESSION: Similar findings of multi nodular goiter. No definitive new or
enlarging thyroid nodules.

## 2016-03-22 DIAGNOSIS — E042 Nontoxic multinodular goiter: Secondary | ICD-10-CM | POA: Diagnosis not present

## 2016-05-17 DIAGNOSIS — H2513 Age-related nuclear cataract, bilateral: Secondary | ICD-10-CM | POA: Diagnosis not present

## 2016-10-25 DIAGNOSIS — Z1231 Encounter for screening mammogram for malignant neoplasm of breast: Secondary | ICD-10-CM | POA: Diagnosis not present

## 2017-01-24 DIAGNOSIS — E042 Nontoxic multinodular goiter: Secondary | ICD-10-CM | POA: Diagnosis not present

## 2017-01-24 DIAGNOSIS — M79604 Pain in right leg: Secondary | ICD-10-CM | POA: Diagnosis not present

## 2017-01-24 DIAGNOSIS — J069 Acute upper respiratory infection, unspecified: Secondary | ICD-10-CM | POA: Diagnosis not present

## 2017-01-24 DIAGNOSIS — Z1211 Encounter for screening for malignant neoplasm of colon: Secondary | ICD-10-CM | POA: Diagnosis not present

## 2017-02-28 DIAGNOSIS — H2513 Age-related nuclear cataract, bilateral: Secondary | ICD-10-CM | POA: Diagnosis not present

## 2017-02-28 DIAGNOSIS — H5213 Myopia, bilateral: Secondary | ICD-10-CM | POA: Diagnosis not present

## 2017-02-28 DIAGNOSIS — H524 Presbyopia: Secondary | ICD-10-CM | POA: Diagnosis not present

## 2017-02-28 DIAGNOSIS — H52222 Regular astigmatism, left eye: Secondary | ICD-10-CM | POA: Diagnosis not present

## 2017-03-09 DIAGNOSIS — M549 Dorsalgia, unspecified: Secondary | ICD-10-CM | POA: Diagnosis not present

## 2017-03-14 DIAGNOSIS — K5140 Inflammatory polyps of colon without complications: Secondary | ICD-10-CM | POA: Diagnosis not present

## 2017-03-14 DIAGNOSIS — Z1211 Encounter for screening for malignant neoplasm of colon: Secondary | ICD-10-CM | POA: Diagnosis not present

## 2017-03-16 DIAGNOSIS — K5140 Inflammatory polyps of colon without complications: Secondary | ICD-10-CM | POA: Diagnosis not present

## 2017-03-23 ENCOUNTER — Other Ambulatory Visit: Payer: Self-pay | Admitting: Internal Medicine

## 2017-03-23 DIAGNOSIS — M810 Age-related osteoporosis without current pathological fracture: Secondary | ICD-10-CM | POA: Diagnosis not present

## 2017-03-23 DIAGNOSIS — E042 Nontoxic multinodular goiter: Secondary | ICD-10-CM

## 2017-03-27 ENCOUNTER — Ambulatory Visit
Admission: RE | Admit: 2017-03-27 | Discharge: 2017-03-27 | Disposition: A | Discharge status: Home / Self Care | Payer: Medicare Other | Source: Ambulatory Visit | Attending: Internal Medicine | Admitting: Internal Medicine

## 2017-03-27 DIAGNOSIS — E042 Nontoxic multinodular goiter: Secondary | ICD-10-CM

## 2017-04-04 ENCOUNTER — Other Ambulatory Visit: Payer: Medicare Other

## 2017-06-28 IMAGING — US US FNA BIOPSY THYROID 1ST LESION
1 series · 13 of 16 positions shown · non-contrast
Comparison: US Thyroid [DATE]

MEDICATIONS:
3 cc 1% lidocaine

COMPLICATIONS:
None immediate.

INDICATION: Indeterminate thyroid nodule

Right thyroid cyst
6.6 cm
EXAM:
ULTRASOUND GUIDED FINE NEEDLE ASPIRATION OF INDETERMINATE THYROID
NODULE
TECHNIQUE: Informed written consent was obtained from the patient after a
discussion of the risks, benefits and alternatives to treatment.
Questions regarding the procedure were encouraged and answered. A
timeout was performed prior to the initiation of the procedure.

[Series 1: us fna biopsy thyroid 1st lesion · 0.10mm/px · 16 acquisitions, 13 frames shown]
[im 1/16]
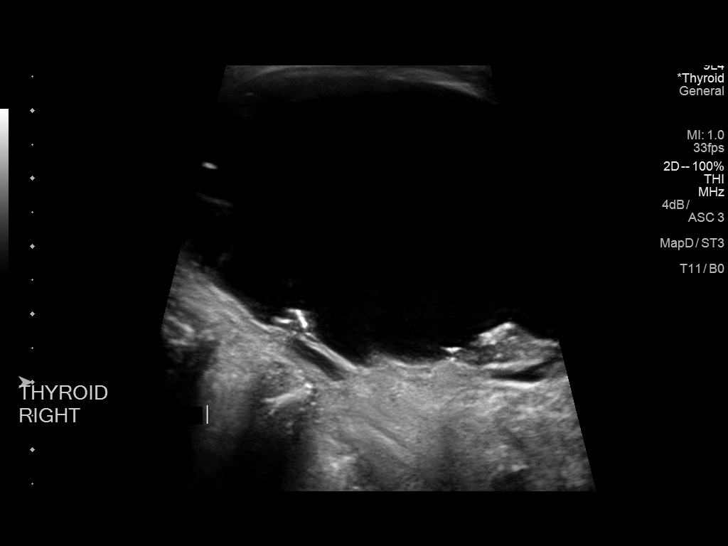
[im 2/16]
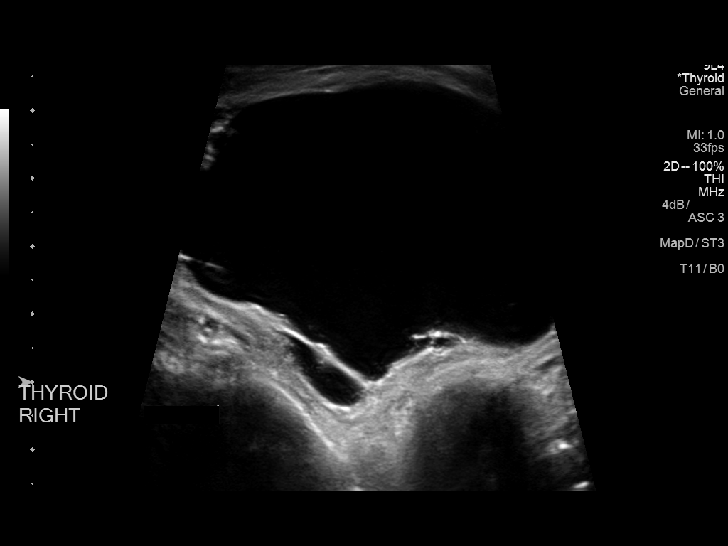
[im 4/16]
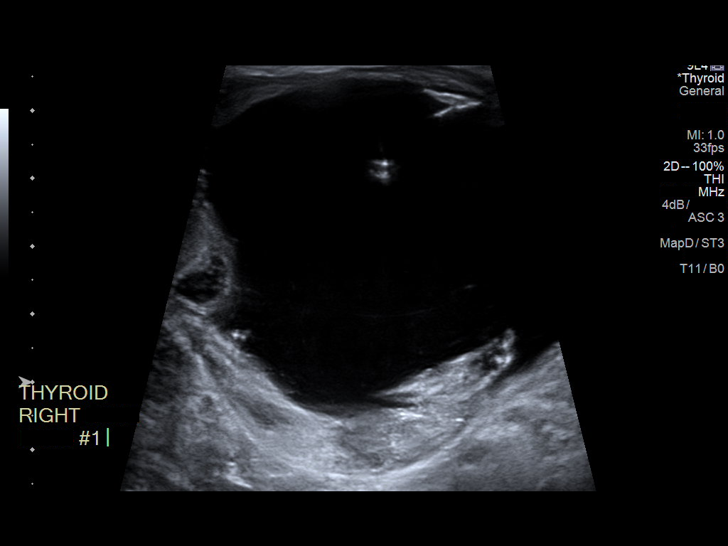
[im 5/16]
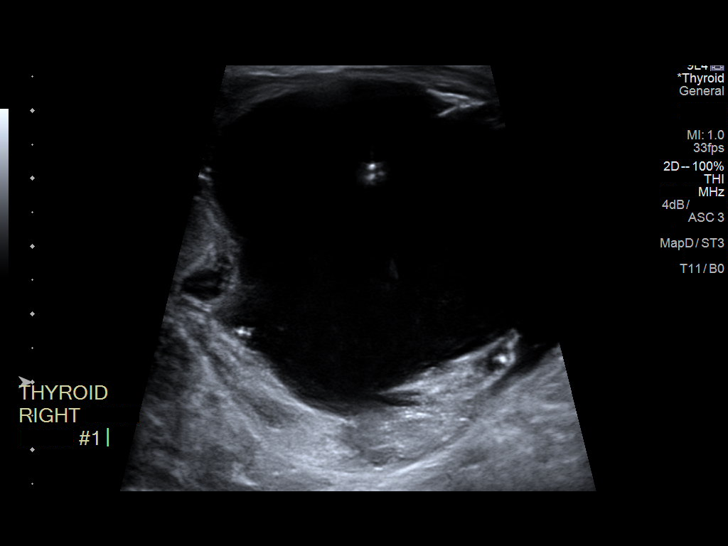
[im 6/16]
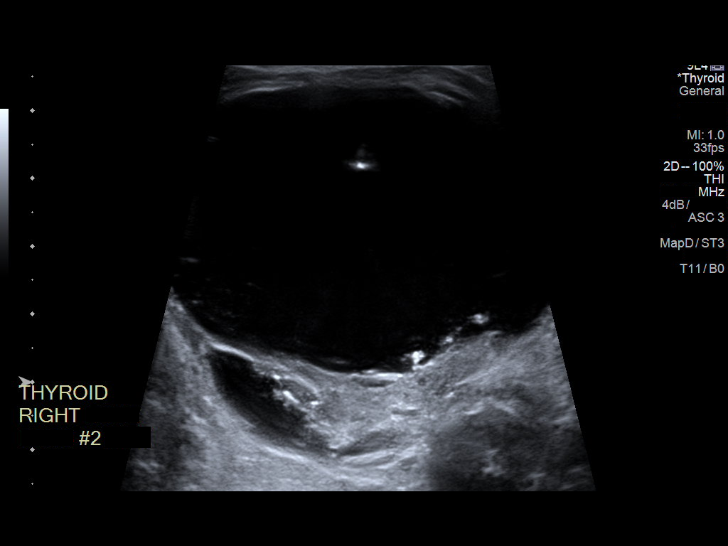
[im 7/16]
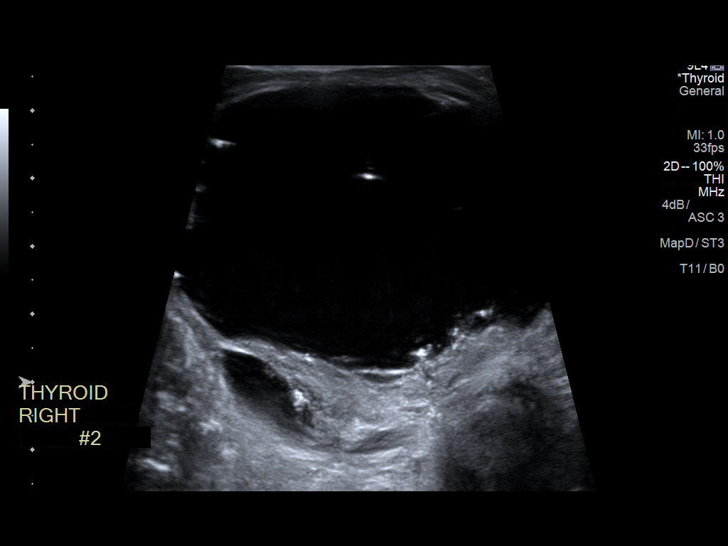
[im 9/16]
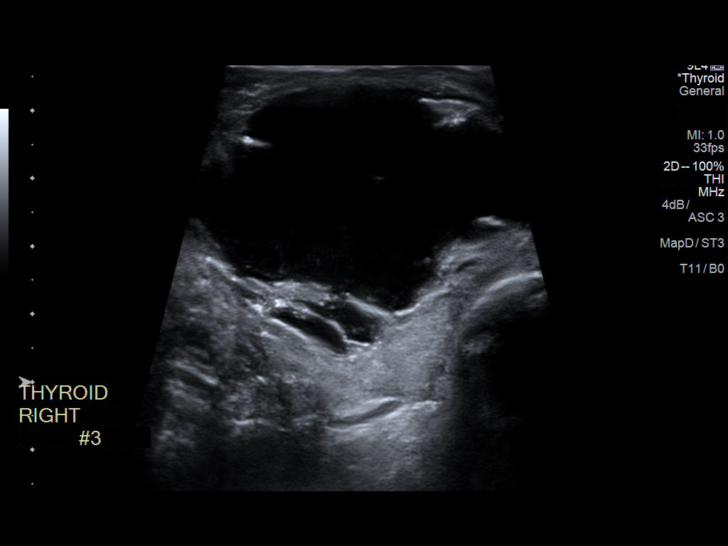
[im 10/16]
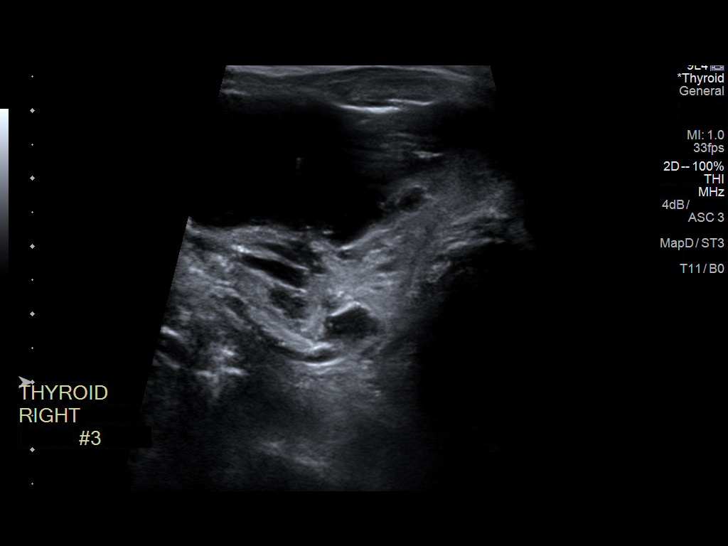
[im 11/16]
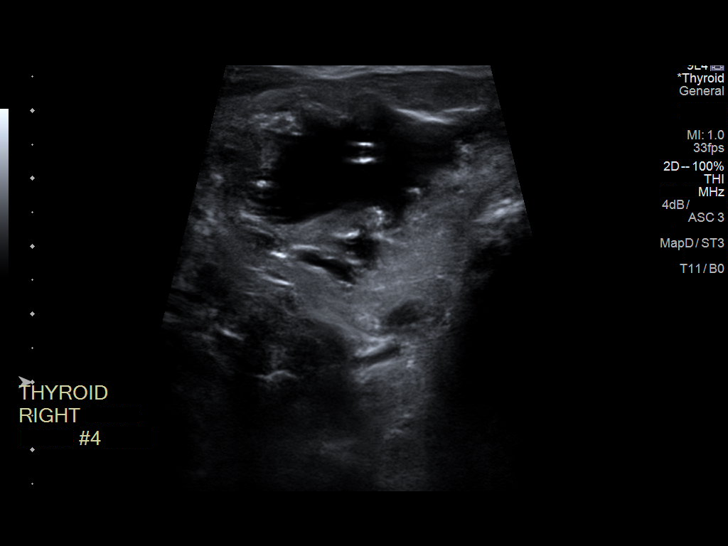
[im 12/16]
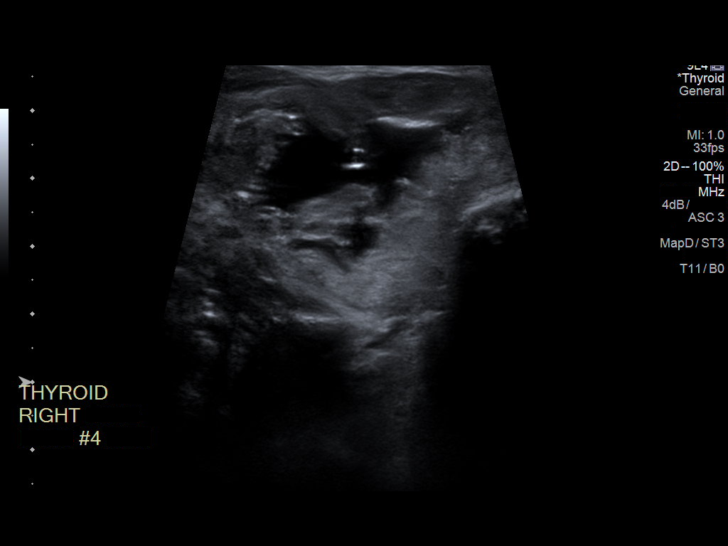
[im 13/16]
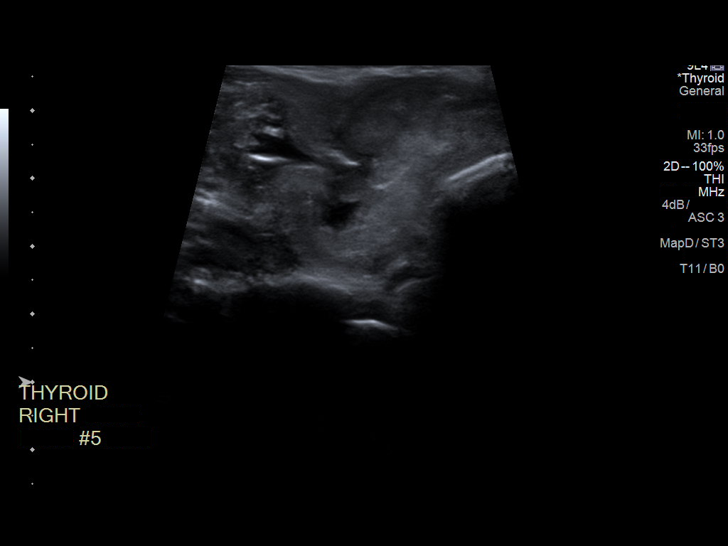
[im 15/16]
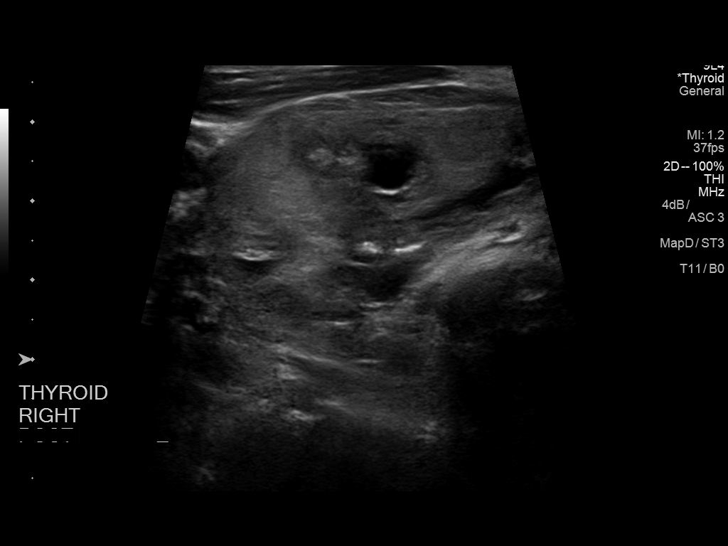
[im 16/16]
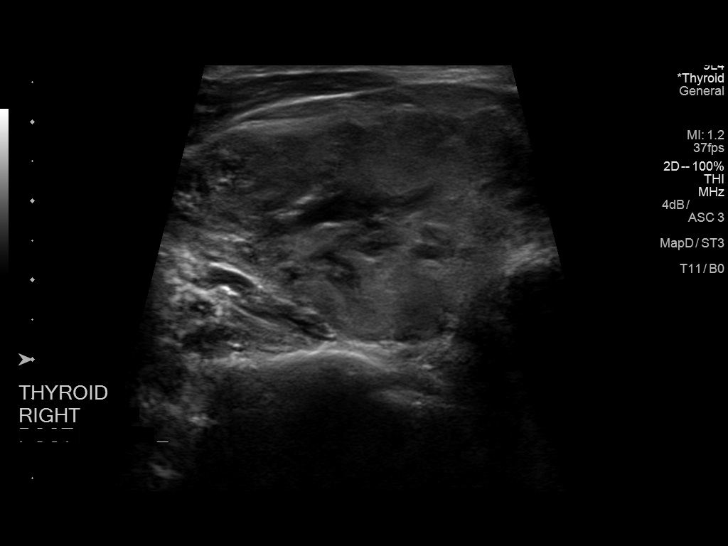

[13 of 16 positions shown; findings below may reference images not displayed]

Pre-procedural ultrasound scanning demonstrated unchanged size and
appearance of the indeterminate nodule within the right thyroid

The procedure was planned. The neck was prepped in the usual sterile
fashion, and a sterile drape was applied covering the operative
field. A timeout was performed prior to the initiation of the
procedure. Local anesthesia was provided with 1% lidocaine.

Under direct ultrasound guidance, aspiration was performed of the
right thyroid cystic mass with a 22 gauge needle. 70 cc colloid
material aspirated. Multiple ultrasound images were saved for
procedural documentation purposes. The samples were prepared and
submitted to cytology.

Limited post procedural scanning was negative for hematoma or
additional complication. Dressings were placed. The patient
tolerated the above procedures procedure well without immediate
postprocedural complication.
IMPRESSION: Technically successful ultrasound guided fine needle aspiration of
right thyroid cystic mass; aspirated 70 cc colloid material without
complication.

Read by

ANIMAH

## 2017-06-30 IMAGING — US US THYROID
1 series · 13 of 25 positions shown · non-contrast
Comparison: [DATE] and previous back to [DATE]

CLINICAL DATA: Nontoxic multinodular goiter. Previous FNA biopsy of
right lesion [DATE], [DATE].

EXAM:
THYROID ULTRASOUND
TECHNIQUE: Ultrasound examination of the thyroid gland and adjacent soft
tissues was performed.

[Series 1: us thyroid · 0.09mm/px · 13 of 68 slices shown]
[im 1/68]
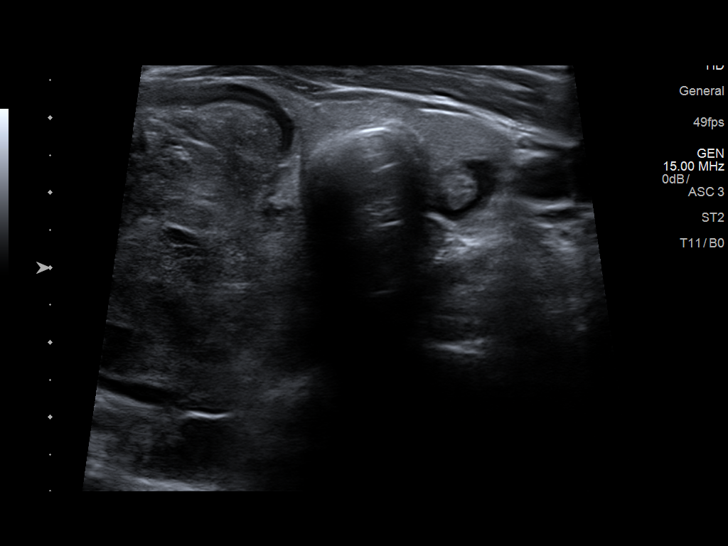
[im 6/68]
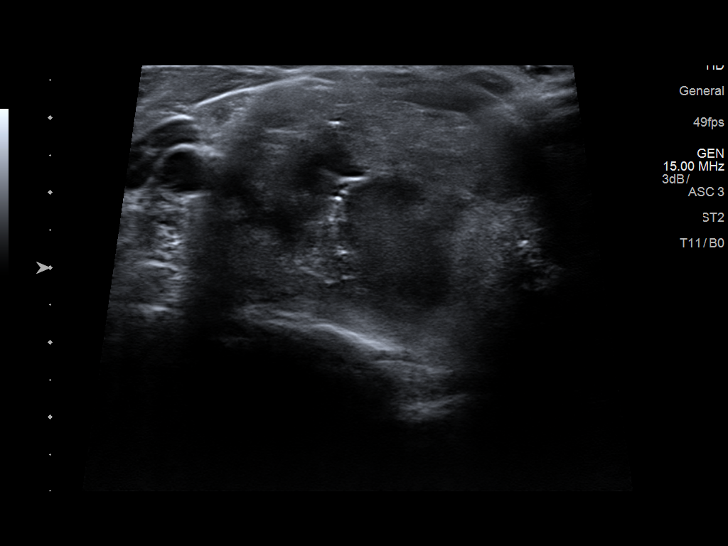
[im 12/68]
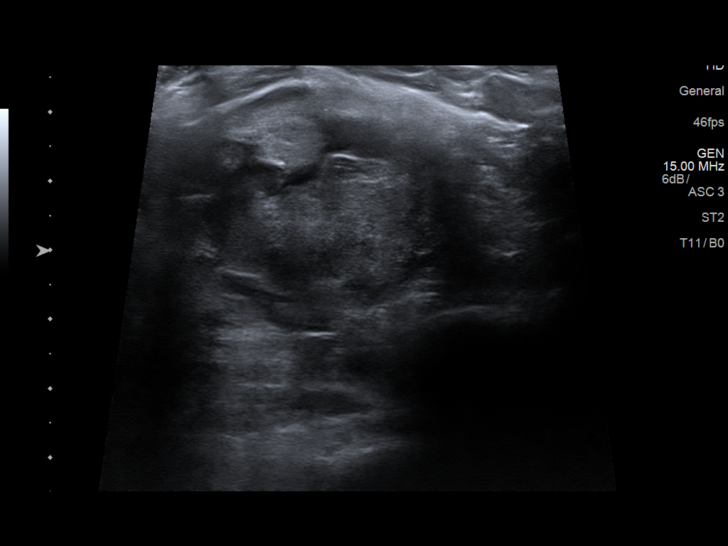
[im 17/68]
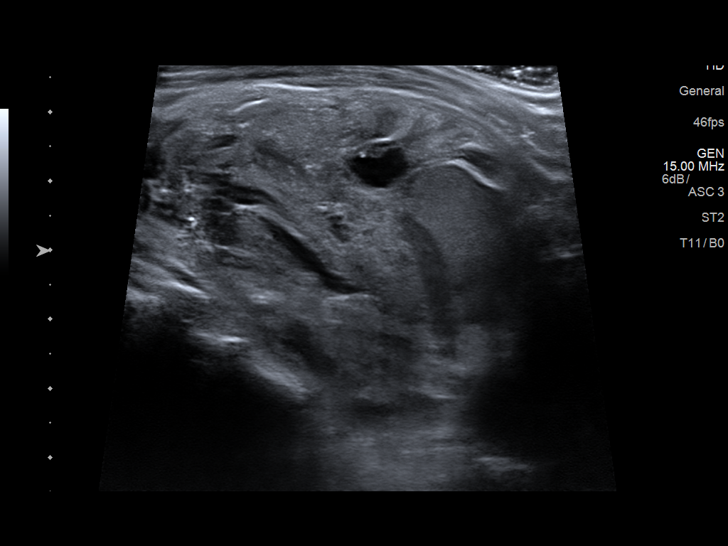
[im 23/68]
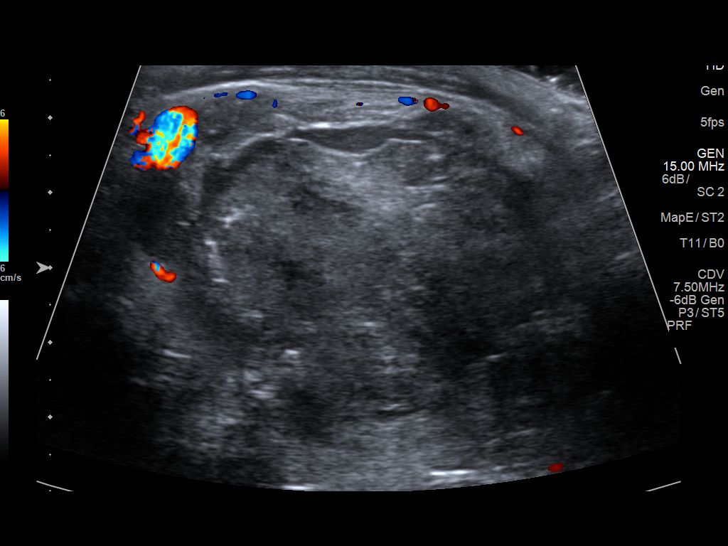
[im 28/68]
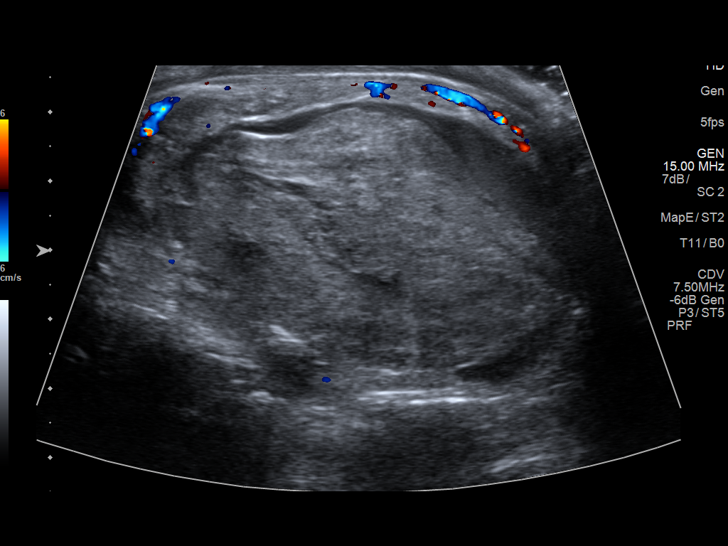
[im 34/68]
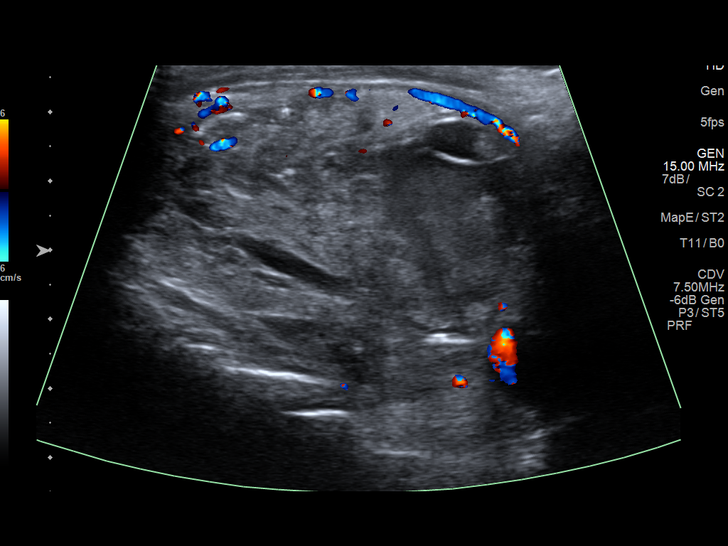
[im 40/68]
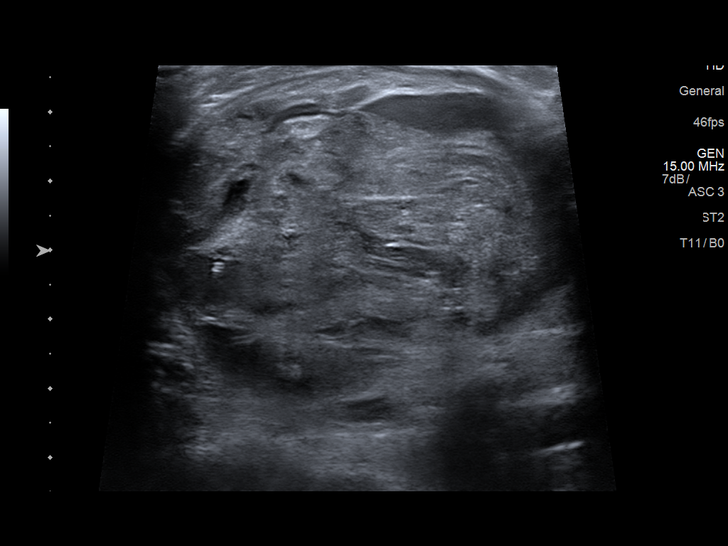
[im 45/68]
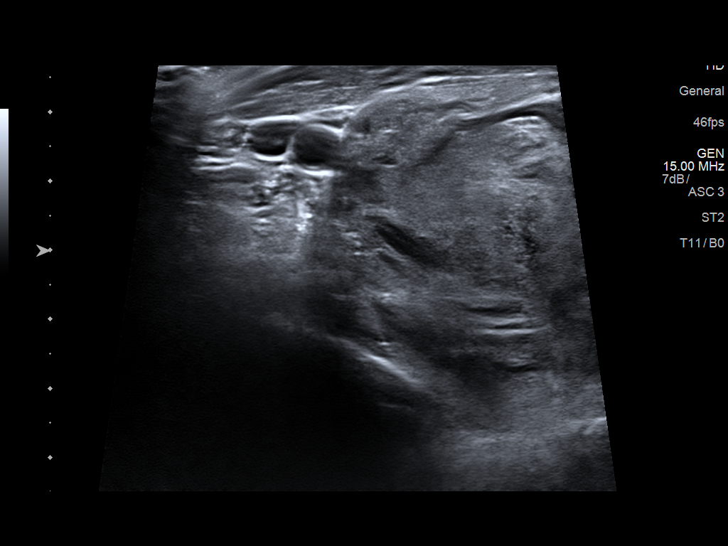
[im 51/68]
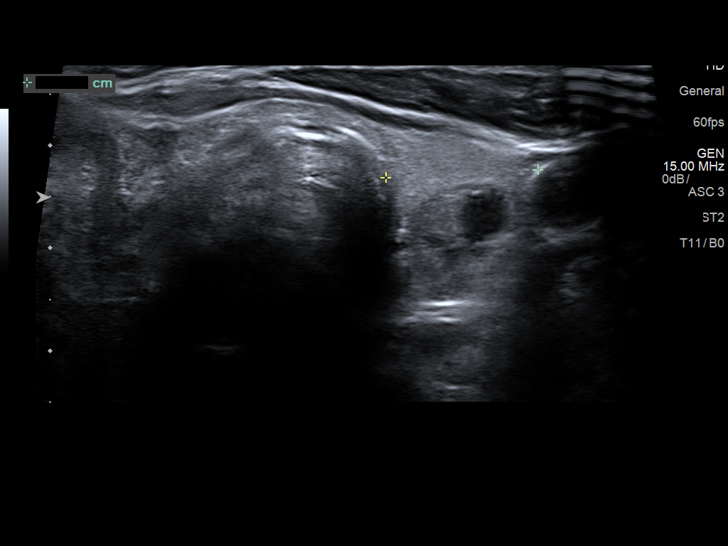
[im 56/68]
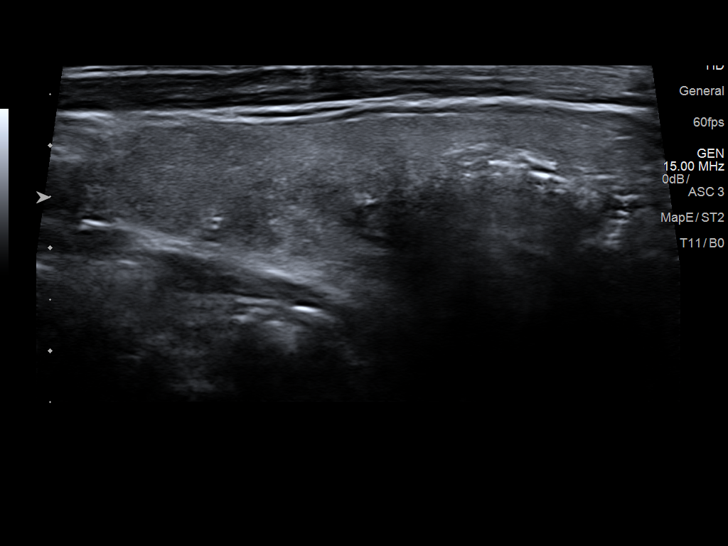
[im 62/68]
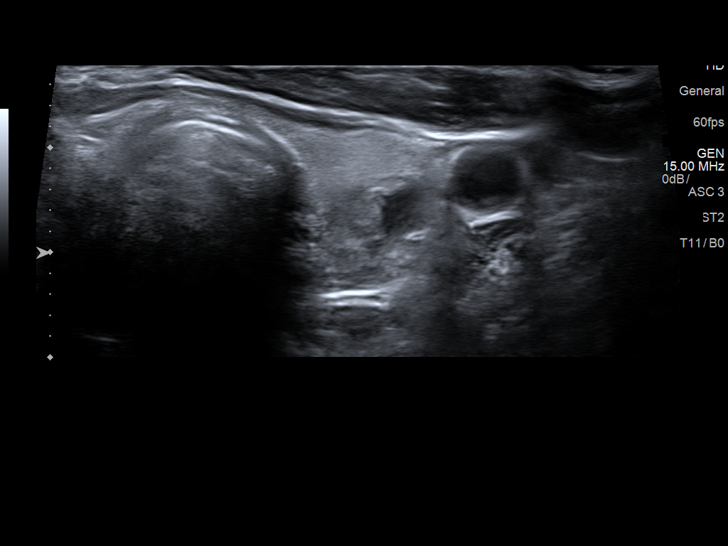
[im 68/68]
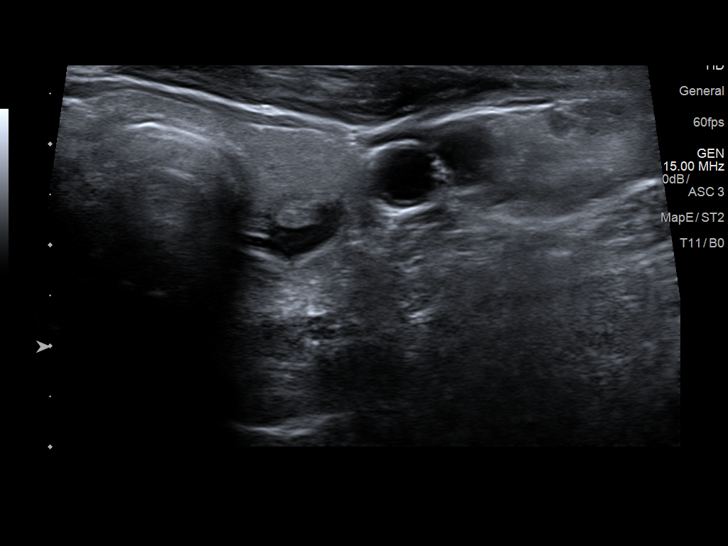

[13 of 25 positions shown; findings below may reference images not displayed]

FINDINGS: Parenchymal Echotexture: Moderately heterogenous

Isthmus: 0.5 cm thickness, previously

Right lobe: 7.3 x 4.8 x 6.5 cm, previously 7.1 x 4.9 x

Left lobe: 5.1 x 1.6 x 1.5 cm, previously 4.6 x 1.4 x

_________________________________________________________

Estimated total number of nodules >/= 1 cm: 2

Number of spongiform nodules >/=  2 cm not described below (TR1): 0

Number of mixed cystic and solid nodules >/= 1.5 cm not described
below (TR2): 0

_________________________________________________________

Nodule # 1: 6.2 x 4.8 x 6.2 mid right cystic lesion, now distended
with thrombus since recent aspiration, previously 6.6 x 4.5 x 6

Nodule # 2: 2.1 x 1 x 1.2 cm mixed solid/cystic, mid left,
previously 1.8 x 1 x 1.1 on [DATE]; stability for greater than 5
years implies benignity.
IMPRESSION: 1. Re-distension of right cystic lesion post aspiration 2 days ago,
secondary to hemorrhage within the lesion. If symptomatic, this may
require ablation for more durable resolution.

The above is in keeping with the ACR TI-RADS recommendations - [HOSPITAL] [US];[DATE].

## 2017-08-06 ENCOUNTER — Other Ambulatory Visit: Payer: Self-pay | Admitting: Podiatry

## 2017-08-06 ENCOUNTER — Encounter: Payer: Self-pay | Admitting: Podiatry

## 2017-08-06 ENCOUNTER — Ambulatory Visit (INDEPENDENT_AMBULATORY_CARE_PROVIDER_SITE_OTHER): Payer: Medicare Other | Admitting: Podiatry

## 2017-08-06 ENCOUNTER — Ambulatory Visit (INDEPENDENT_AMBULATORY_CARE_PROVIDER_SITE_OTHER): Payer: Medicare Other

## 2017-08-06 DIAGNOSIS — M79672 Pain in left foot: Secondary | ICD-10-CM

## 2017-08-06 DIAGNOSIS — M79671 Pain in right foot: Secondary | ICD-10-CM

## 2017-08-06 DIAGNOSIS — L84 Corns and callosities: Secondary | ICD-10-CM

## 2017-08-06 DIAGNOSIS — M779 Enthesopathy, unspecified: Secondary | ICD-10-CM | POA: Diagnosis not present

## 2017-08-06 MED ORDER — TRIAMCINOLONE ACETONIDE 10 MG/ML IJ SUSP
10.0000 mg | Freq: Once | INTRAMUSCULAR | Status: AC
  Administered 2017-08-06: 10 mg

## 2017-08-06 NOTE — Progress Notes (Signed)
   Subjective:    Patient ID: Shelby Pace, female    DOB: [birth year], 66 y.o.   MRN: [id number]  HPI    Review of Systems  All other systems reviewed and are negative.      Objective:   Physical Exam        Assessment & Plan:

## 2017-08-06 NOTE — Patient Instructions (Signed)
Warts Warts are small growths on the skin. They are common, and they are caused by a type of germ (virus). Warts can occur on many areas of the body. A person may have one wart or more than one wart. Warts can spread if you scratch a wart and then scratch normal skin. Most warts will go away over many months to a couple years. Treatments may be done if needed. Follow these instructions at home:  Apply over-the-counter and prescription medicines only as told by your doctor.  Do not apply over-the-counter wart medicines to your face or genitals before you ask your doctor if it is okay to do that.  Do not scratch or pick at a wart.  Wash your hands after you touch a wart.  Avoid shaving hair that is over a wart.  Keep all follow-up visits as told by your doctor. This is important. Contact a doctor if:  Your warts do not improve after treatment.  You have redness, swelling, or pain at the site of a wart.  You have bleeding from a wart, and the bleeding does not stop when you put light pressure on the wart.  You have diabetes and you get a wart. This information is not intended to replace advice given to you by your health care provider. Make sure you discuss any questions you have with your health care provider. Document Released: 11/24/2010 Document Revised: 12/30/2015 Document Reviewed: 10/19/2014 Elsevier Interactive Patient Education  2018 [employer]  

## 2017-08-06 NOTE — Progress Notes (Signed)
Subjective:   Patient ID: Shelby Pace, female   DOB: 66 y.o.   MRN: [id number]   HPI Patient presents stating she is having a lot of pain underneath the left foot with inflammation and fluid around the fifth metatarsal and no history of what may have occurred.  Also complains of pain on the dorsum of the left foot and discoloration of the nail   Review of Systems  All other systems reviewed and are negative.       Objective:  Physical Exam  Constitutional: She appears well-developed and well-nourished.  Cardiovascular: Intact distal pulses.  Pulmonary/Chest: Effort normal.  Musculoskeletal: Normal range of motion.  Neurological: She is alert.  Skin: Skin is warm.  Nursing note and vitals reviewed.   Neurovascular status intact muscle strength adequate range of motion within normal limits with patient noted to have severe keratotic lesion sub-fifth metatarsal left with fluid buildup around the fifth MPJ and discomfort in the dorsal metatarsal with fluid buildup around the midtarsal joint left and slight nail discoloration hallux right and left foot     Assessment:  Inflammatory capsulitis fifth MPJ left with keratotic lesion formation along with dorsal tendinitis left and moderate mycotic nail infection bilateral     Plan:  H&P condition reviewed and careful capsular injection administered left 3 mg dexamethasone Kenalog 5 mg Xylocaine and I then went ahead and debrided the lesion fully discussed possible treatment for the dorsal left and recommended no treatment for the nail condition currently.  Reappoint as symptoms indicate

## 2017-10-09 DIAGNOSIS — M79604 Pain in right leg: Secondary | ICD-10-CM | POA: Diagnosis not present

## 2017-11-05 DIAGNOSIS — Z1231 Encounter for screening mammogram for malignant neoplasm of breast: Secondary | ICD-10-CM | POA: Diagnosis not present

## 2017-11-14 DIAGNOSIS — M85852 Other specified disorders of bone density and structure, left thigh: Secondary | ICD-10-CM | POA: Diagnosis not present

## 2017-11-14 DIAGNOSIS — M810 Age-related osteoporosis without current pathological fracture: Secondary | ICD-10-CM | POA: Diagnosis not present

## 2017-12-27 DIAGNOSIS — E042 Nontoxic multinodular goiter: Secondary | ICD-10-CM | POA: Diagnosis not present

## 2017-12-30 DIAGNOSIS — E042 Nontoxic multinodular goiter: Secondary | ICD-10-CM | POA: Insufficient documentation

## 2018-03-01 DIAGNOSIS — H2513 Age-related nuclear cataract, bilateral: Secondary | ICD-10-CM | POA: Diagnosis not present

## 2018-03-28 ENCOUNTER — Other Ambulatory Visit: Payer: Self-pay | Admitting: Internal Medicine

## 2018-03-28 DIAGNOSIS — E042 Nontoxic multinodular goiter: Secondary | ICD-10-CM | POA: Diagnosis not present

## 2018-03-28 DIAGNOSIS — E559 Vitamin D deficiency, unspecified: Secondary | ICD-10-CM | POA: Diagnosis not present

## 2018-03-28 DIAGNOSIS — M810 Age-related osteoporosis without current pathological fracture: Secondary | ICD-10-CM | POA: Diagnosis not present

## 2018-04-03 ENCOUNTER — Ambulatory Visit
Admission: RE | Admit: 2018-04-03 | Discharge: 2018-04-03 | Disposition: A | Discharge status: Home / Self Care | Payer: Medicare Other | Source: Ambulatory Visit | Attending: Internal Medicine | Admitting: Internal Medicine

## 2018-04-03 DIAGNOSIS — E041 Nontoxic single thyroid nodule: Secondary | ICD-10-CM | POA: Diagnosis not present

## 2018-04-03 DIAGNOSIS — E042 Nontoxic multinodular goiter: Secondary | ICD-10-CM

## 2018-04-03 IMAGING — US US THYROID
2 series · 13 of 25 positions shown · non-contrast
Comparison: Most recent prior thyroid ultrasound [DATE]; 5 year
prior thyroid ultrasound [DATE]

CLINICAL DATA: Goiter. 67-year-old female with a history of
bilateral thyroid nodules. Patient previously underwent biopsy of
the largest right-sided thyroid nodule on [DATE].

EXAM:
THYROID ULTRASOUND
TECHNIQUE: Ultrasound examination of the thyroid gland and adjacent soft
tissues was performed.

[Series 1: us thyroid · 0.07mm/px · 12 of 46 slices shown (1 of 2)]
[im 1/46]
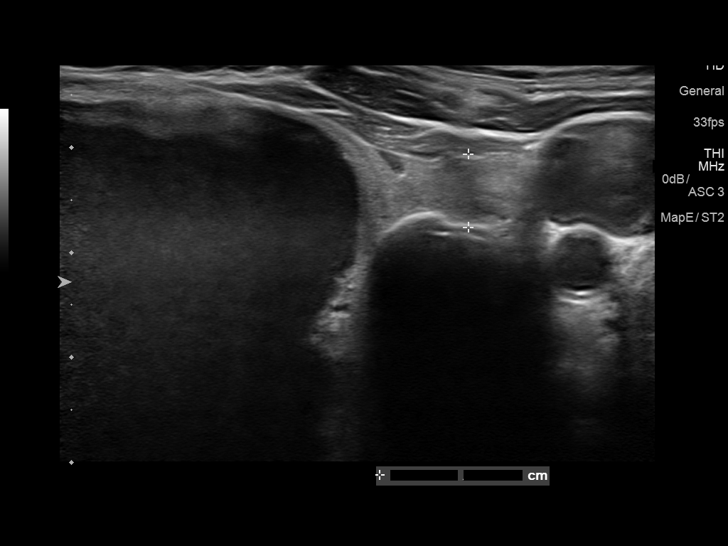
[im 4/46]
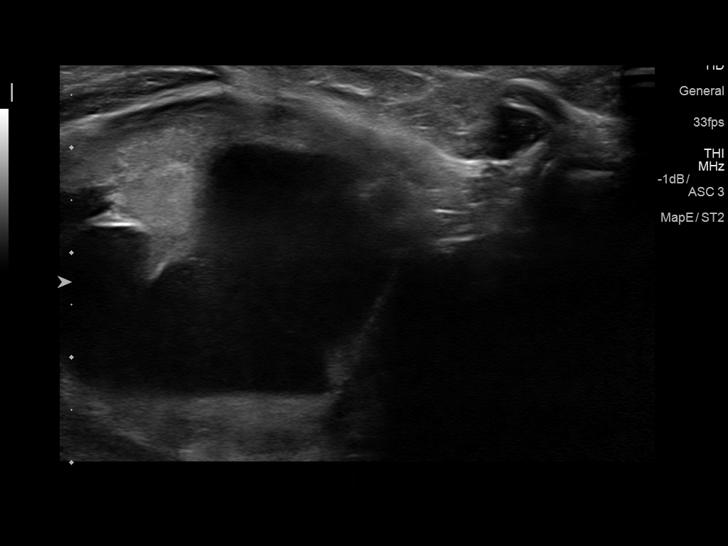
[im 8/46]
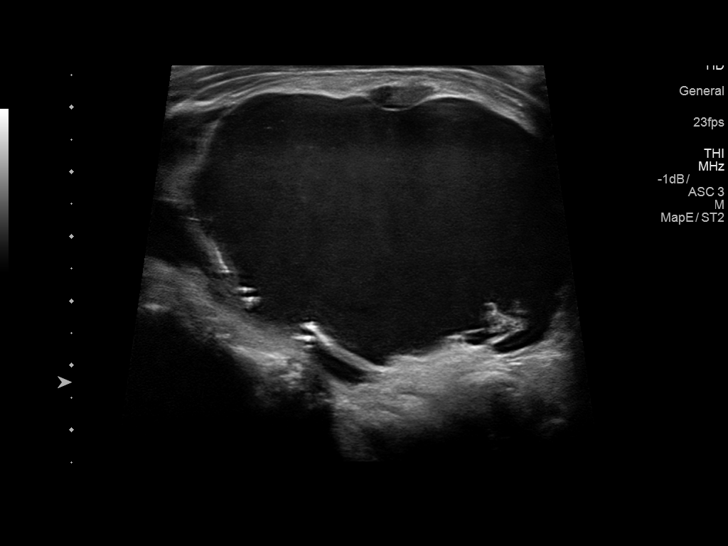
[im 12/46]
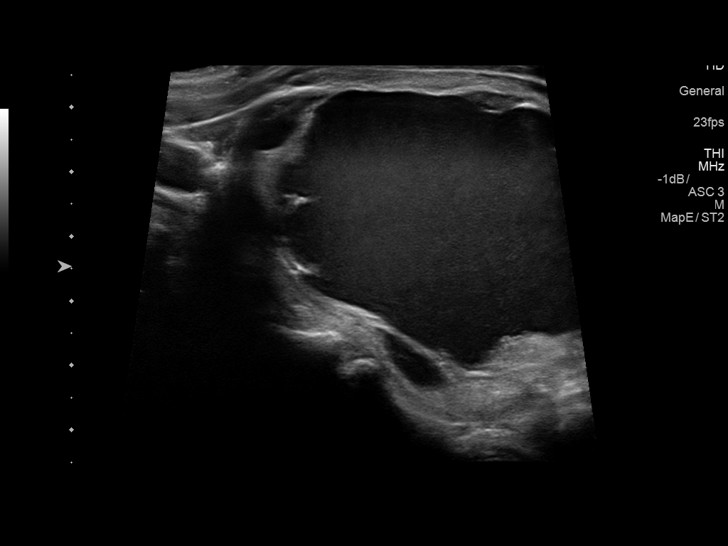
[im 16/46]
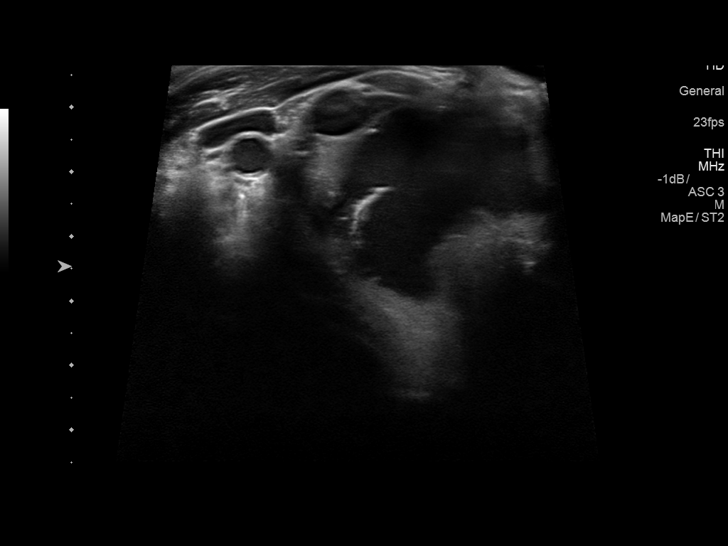
[im 20/46]
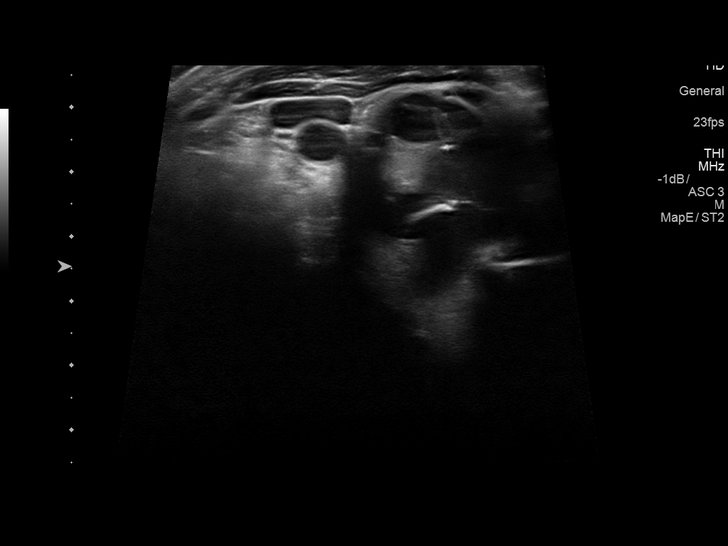
[im 24/46]
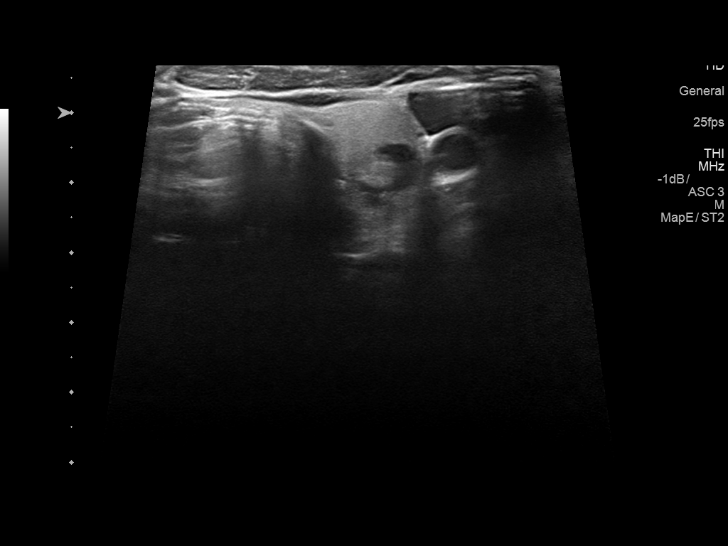
[im 28/46]
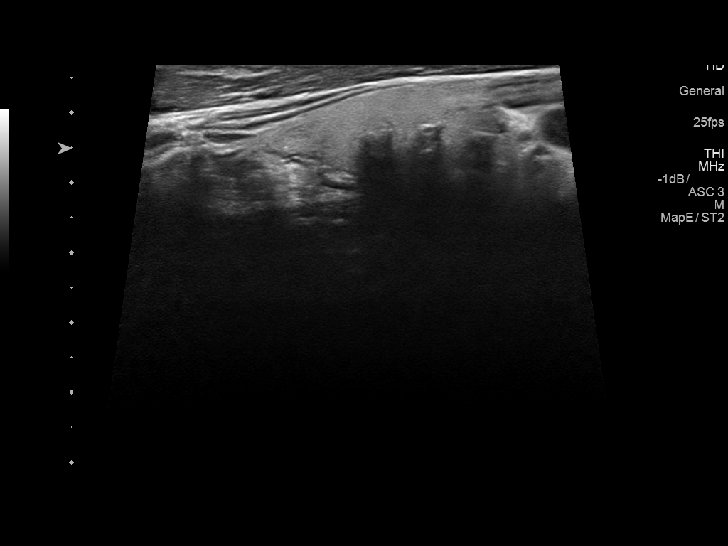
[im 32/46]
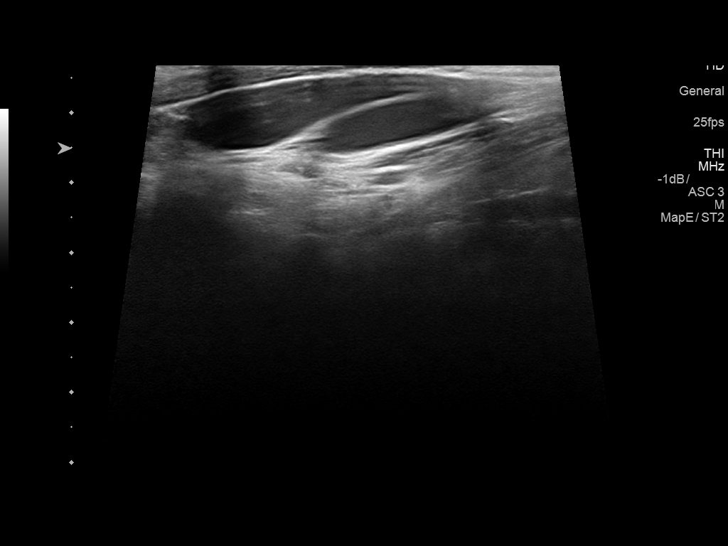
[im 36/46]
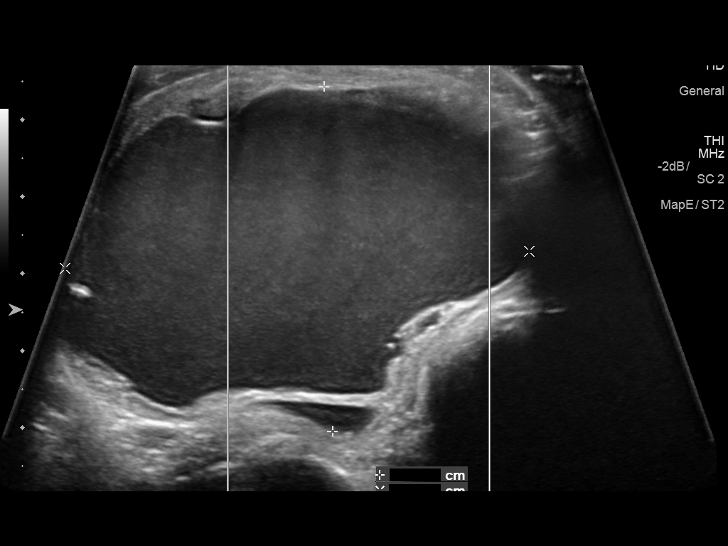
[im 40/46]
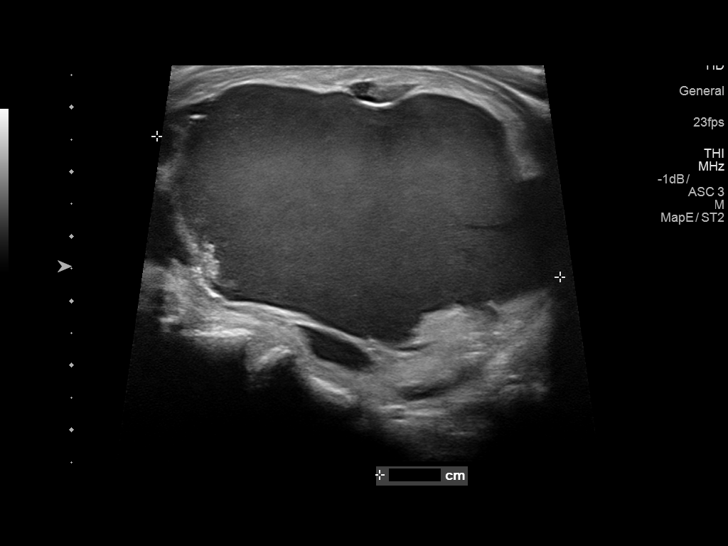
[im 44/46]
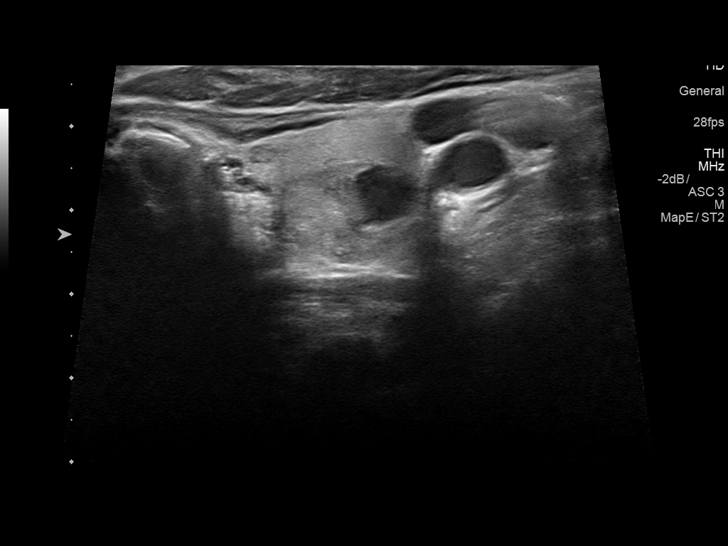

[Series 2001: us thyroid · 0.11mm/px · 1 of 1 slices shown (2 of 2)]
[im 1/1]
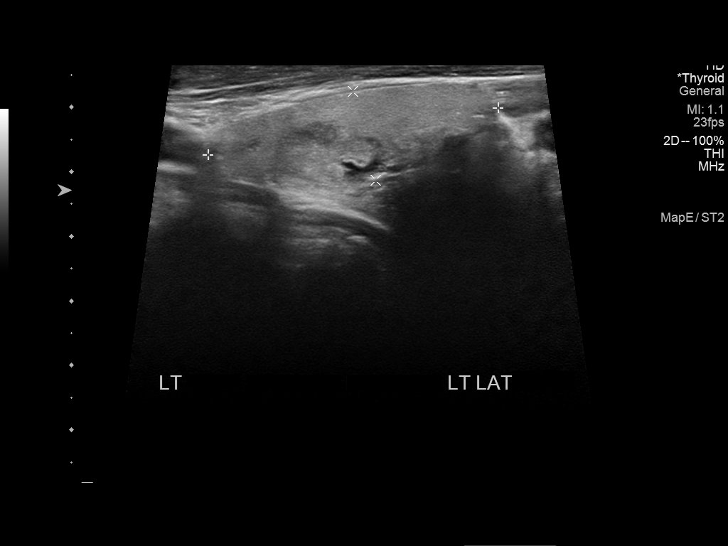

[13 of 25 positions shown; findings below may reference images not displayed]

FINDINGS: Parenchymal Echotexture: Normal

Isthmus: 0.7 cm

Right lobe: 7.1 x 4.9 x 6.3 cm

Left lobe: 4.6 x 1.4 x 1.4 cm

_________________________________________________________

Estimated total number of nodules >/= 1 cm: 2

Number of spongiform nodules >/=  2 cm not described below (TR1): 0

Number of mixed cystic and solid nodules >/= 1.5 cm not described
below (TR2): 0

_________________________________________________________

Progressive enlargement of the previously biopsied cystic mass
occupying the majority of the right thyroid gland. The mass is now
almost entirely cystic and measures 6.6 x 4.5 x 6.0 cm today
compared to 5.9 x 3.6 x 5.1 cm previously.

Slight interval involution of the isoechoic predominantly solid
nodule in the left mid gland compared to [DATE].
Involution/stability over 5 years is consistent with benignity.
IMPRESSION: 1. Continued enlargement of the previously biopsied right-sided
thyroid nodule which has undergone near complete cystic degeneration
and exists as a large minimally complex cyst occupying the entirety
of the right thyroid gland exerting local mass effect and resulting
in mild right to left midline shift of the thyroid gland. Although
this lesion does not specifically meet criteria for biopsy,
aspiration could be considered for symptomatic or cosmetic reasons.
2. Confirmed 5 year stability of the nodule within the left mid
thyroid gland consistent with benignity. No further follow-up
required.

The above is in keeping with the ACR TI-RADS recommendations - [HOSPITAL] [B7];[DATE].

## 2018-04-18 ENCOUNTER — Other Ambulatory Visit: Payer: Self-pay | Admitting: Internal Medicine

## 2018-04-18 DIAGNOSIS — Z23 Encounter for immunization: Secondary | ICD-10-CM | POA: Diagnosis not present

## 2018-04-18 DIAGNOSIS — E042 Nontoxic multinodular goiter: Secondary | ICD-10-CM

## 2018-04-18 DIAGNOSIS — E559 Vitamin D deficiency, unspecified: Secondary | ICD-10-CM | POA: Diagnosis not present

## 2018-04-18 DIAGNOSIS — M810 Age-related osteoporosis without current pathological fracture: Secondary | ICD-10-CM | POA: Diagnosis not present

## 2018-04-24 ENCOUNTER — Ambulatory Visit
Admission: RE | Admit: 2018-04-24 | Discharge: 2018-04-24 | Disposition: A | Discharge status: Home / Self Care | Payer: Medicare Other | Source: Ambulatory Visit | Attending: Internal Medicine | Admitting: Internal Medicine

## 2018-04-24 ENCOUNTER — Other Ambulatory Visit (HOSPITAL_COMMUNITY)
Admission: RE | Admit: 2018-04-24 | Discharge: 2018-04-24 | Disposition: A | Discharge status: Home / Self Care | Payer: Medicare Other | Source: Ambulatory Visit | Attending: Radiology | Admitting: Radiology

## 2018-04-24 DIAGNOSIS — E041 Nontoxic single thyroid nodule: Secondary | ICD-10-CM | POA: Insufficient documentation

## 2018-04-24 DIAGNOSIS — E042 Nontoxic multinodular goiter: Secondary | ICD-10-CM

## 2018-04-25 ENCOUNTER — Other Ambulatory Visit: Payer: Self-pay | Admitting: Internal Medicine

## 2018-04-25 ENCOUNTER — Other Ambulatory Visit (HOSPITAL_COMMUNITY): Payer: Self-pay | Admitting: Internal Medicine

## 2018-04-25 DIAGNOSIS — E042 Nontoxic multinodular goiter: Secondary | ICD-10-CM

## 2018-04-26 ENCOUNTER — Ambulatory Visit (HOSPITAL_COMMUNITY)
Admission: RE | Admit: 2018-04-26 | Discharge: 2018-04-26 | Disposition: A | Discharge status: Home / Self Care | Payer: Medicare Other | Source: Ambulatory Visit | Attending: Internal Medicine | Admitting: Internal Medicine

## 2018-04-26 DIAGNOSIS — E042 Nontoxic multinodular goiter: Secondary | ICD-10-CM | POA: Insufficient documentation

## 2018-05-22 DIAGNOSIS — E042 Nontoxic multinodular goiter: Secondary | ICD-10-CM | POA: Diagnosis not present

## 2018-05-24 ENCOUNTER — Other Ambulatory Visit: Payer: Self-pay | Admitting: Internal Medicine

## 2018-05-24 DIAGNOSIS — E042 Nontoxic multinodular goiter: Secondary | ICD-10-CM

## 2018-05-31 DIAGNOSIS — Z6828 Body mass index (BMI) 28.0-28.9, adult: Secondary | ICD-10-CM | POA: Diagnosis not present

## 2018-05-31 DIAGNOSIS — M810 Age-related osteoporosis without current pathological fracture: Secondary | ICD-10-CM | POA: Diagnosis not present

## 2018-06-25 ENCOUNTER — Ambulatory Visit
Admission: RE | Admit: 2018-06-25 | Discharge: 2018-06-25 | Disposition: A | Discharge status: Home / Self Care | Payer: Medicare Other | Source: Ambulatory Visit | Attending: Internal Medicine | Admitting: Internal Medicine

## 2018-06-25 ENCOUNTER — Other Ambulatory Visit: Payer: Self-pay | Admitting: Internal Medicine

## 2018-06-25 DIAGNOSIS — E041 Nontoxic single thyroid nodule: Secondary | ICD-10-CM | POA: Diagnosis not present

## 2018-06-25 DIAGNOSIS — E042 Nontoxic multinodular goiter: Secondary | ICD-10-CM

## 2018-07-03 DIAGNOSIS — J029 Acute pharyngitis, unspecified: Secondary | ICD-10-CM | POA: Diagnosis not present

## 2018-07-16 ENCOUNTER — Other Ambulatory Visit: Payer: Self-pay | Admitting: Internal Medicine

## 2018-07-16 DIAGNOSIS — E042 Nontoxic multinodular goiter: Secondary | ICD-10-CM

## 2018-12-06 DIAGNOSIS — M79604 Pain in right leg: Secondary | ICD-10-CM | POA: Diagnosis not present

## 2018-12-06 DIAGNOSIS — M542 Cervicalgia: Secondary | ICD-10-CM | POA: Diagnosis not present

## 2018-12-31 DIAGNOSIS — M542 Cervicalgia: Secondary | ICD-10-CM | POA: Diagnosis not present

## 2019-01-01 DIAGNOSIS — M542 Cervicalgia: Secondary | ICD-10-CM | POA: Diagnosis not present

## 2019-01-20 DIAGNOSIS — M542 Cervicalgia: Secondary | ICD-10-CM | POA: Diagnosis not present

## 2019-01-20 DIAGNOSIS — M6281 Muscle weakness (generalized): Secondary | ICD-10-CM | POA: Diagnosis not present

## 2019-01-20 DIAGNOSIS — R51 Headache: Secondary | ICD-10-CM | POA: Diagnosis not present

## 2019-01-20 DIAGNOSIS — M2560 Stiffness of unspecified joint, not elsewhere classified: Secondary | ICD-10-CM | POA: Diagnosis not present

## 2019-01-20 DIAGNOSIS — M799 Soft tissue disorder, unspecified: Secondary | ICD-10-CM | POA: Diagnosis not present

## 2019-01-23 DIAGNOSIS — R51 Headache: Secondary | ICD-10-CM | POA: Diagnosis not present

## 2019-01-23 DIAGNOSIS — M2560 Stiffness of unspecified joint, not elsewhere classified: Secondary | ICD-10-CM | POA: Diagnosis not present

## 2019-01-23 DIAGNOSIS — M542 Cervicalgia: Secondary | ICD-10-CM | POA: Diagnosis not present

## 2019-01-23 DIAGNOSIS — M6281 Muscle weakness (generalized): Secondary | ICD-10-CM | POA: Diagnosis not present

## 2019-01-23 DIAGNOSIS — M799 Soft tissue disorder, unspecified: Secondary | ICD-10-CM | POA: Diagnosis not present

## 2019-01-27 DIAGNOSIS — M6281 Muscle weakness (generalized): Secondary | ICD-10-CM | POA: Diagnosis not present

## 2019-01-27 DIAGNOSIS — M799 Soft tissue disorder, unspecified: Secondary | ICD-10-CM | POA: Diagnosis not present

## 2019-01-27 DIAGNOSIS — R51 Headache: Secondary | ICD-10-CM | POA: Diagnosis not present

## 2019-01-27 DIAGNOSIS — M542 Cervicalgia: Secondary | ICD-10-CM | POA: Diagnosis not present

## 2019-01-27 DIAGNOSIS — M2560 Stiffness of unspecified joint, not elsewhere classified: Secondary | ICD-10-CM | POA: Diagnosis not present

## 2019-01-29 DIAGNOSIS — M2560 Stiffness of unspecified joint, not elsewhere classified: Secondary | ICD-10-CM | POA: Diagnosis not present

## 2019-01-29 DIAGNOSIS — M799 Soft tissue disorder, unspecified: Secondary | ICD-10-CM | POA: Diagnosis not present

## 2019-01-29 DIAGNOSIS — M6281 Muscle weakness (generalized): Secondary | ICD-10-CM | POA: Diagnosis not present

## 2019-01-29 DIAGNOSIS — M542 Cervicalgia: Secondary | ICD-10-CM | POA: Diagnosis not present

## 2019-01-29 DIAGNOSIS — R51 Headache: Secondary | ICD-10-CM | POA: Diagnosis not present

## 2019-02-04 DIAGNOSIS — M6281 Muscle weakness (generalized): Secondary | ICD-10-CM | POA: Diagnosis not present

## 2019-02-04 DIAGNOSIS — M542 Cervicalgia: Secondary | ICD-10-CM | POA: Diagnosis not present

## 2019-02-04 DIAGNOSIS — M799 Soft tissue disorder, unspecified: Secondary | ICD-10-CM | POA: Diagnosis not present

## 2019-02-04 DIAGNOSIS — R51 Headache: Secondary | ICD-10-CM | POA: Diagnosis not present

## 2019-02-04 DIAGNOSIS — M2560 Stiffness of unspecified joint, not elsewhere classified: Secondary | ICD-10-CM | POA: Diagnosis not present

## 2019-02-05 DIAGNOSIS — R51 Headache: Secondary | ICD-10-CM | POA: Diagnosis not present

## 2019-02-05 DIAGNOSIS — M542 Cervicalgia: Secondary | ICD-10-CM | POA: Diagnosis not present

## 2019-02-05 DIAGNOSIS — M6281 Muscle weakness (generalized): Secondary | ICD-10-CM | POA: Diagnosis not present

## 2019-02-05 DIAGNOSIS — M799 Soft tissue disorder, unspecified: Secondary | ICD-10-CM | POA: Diagnosis not present

## 2019-02-05 DIAGNOSIS — M2560 Stiffness of unspecified joint, not elsewhere classified: Secondary | ICD-10-CM | POA: Diagnosis not present

## 2019-02-10 DIAGNOSIS — M6281 Muscle weakness (generalized): Secondary | ICD-10-CM | POA: Diagnosis not present

## 2019-02-10 DIAGNOSIS — M799 Soft tissue disorder, unspecified: Secondary | ICD-10-CM | POA: Diagnosis not present

## 2019-02-10 DIAGNOSIS — M542 Cervicalgia: Secondary | ICD-10-CM | POA: Diagnosis not present

## 2019-02-10 DIAGNOSIS — M2560 Stiffness of unspecified joint, not elsewhere classified: Secondary | ICD-10-CM | POA: Diagnosis not present

## 2019-02-10 DIAGNOSIS — R51 Headache: Secondary | ICD-10-CM | POA: Diagnosis not present

## 2019-02-12 DIAGNOSIS — M4726 Other spondylosis with radiculopathy, lumbar region: Secondary | ICD-10-CM | POA: Diagnosis not present

## 2019-02-12 DIAGNOSIS — M47812 Spondylosis without myelopathy or radiculopathy, cervical region: Secondary | ICD-10-CM | POA: Diagnosis not present

## 2019-02-17 DIAGNOSIS — M6281 Muscle weakness (generalized): Secondary | ICD-10-CM | POA: Diagnosis not present

## 2019-02-17 DIAGNOSIS — M2560 Stiffness of unspecified joint, not elsewhere classified: Secondary | ICD-10-CM | POA: Diagnosis not present

## 2019-02-17 DIAGNOSIS — M542 Cervicalgia: Secondary | ICD-10-CM | POA: Diagnosis not present

## 2019-02-17 DIAGNOSIS — M799 Soft tissue disorder, unspecified: Secondary | ICD-10-CM | POA: Diagnosis not present

## 2019-02-17 DIAGNOSIS — R51 Headache: Secondary | ICD-10-CM | POA: Diagnosis not present

## 2019-02-19 DIAGNOSIS — M2560 Stiffness of unspecified joint, not elsewhere classified: Secondary | ICD-10-CM | POA: Diagnosis not present

## 2019-02-19 DIAGNOSIS — M542 Cervicalgia: Secondary | ICD-10-CM | POA: Diagnosis not present

## 2019-02-19 DIAGNOSIS — M6281 Muscle weakness (generalized): Secondary | ICD-10-CM | POA: Diagnosis not present

## 2019-02-19 DIAGNOSIS — R51 Headache: Secondary | ICD-10-CM | POA: Diagnosis not present

## 2019-02-19 DIAGNOSIS — M799 Soft tissue disorder, unspecified: Secondary | ICD-10-CM | POA: Diagnosis not present

## 2019-02-24 DIAGNOSIS — M2560 Stiffness of unspecified joint, not elsewhere classified: Secondary | ICD-10-CM | POA: Diagnosis not present

## 2019-02-24 DIAGNOSIS — M542 Cervicalgia: Secondary | ICD-10-CM | POA: Diagnosis not present

## 2019-02-24 DIAGNOSIS — M6281 Muscle weakness (generalized): Secondary | ICD-10-CM | POA: Diagnosis not present

## 2019-02-24 DIAGNOSIS — M799 Soft tissue disorder, unspecified: Secondary | ICD-10-CM | POA: Diagnosis not present

## 2019-02-24 DIAGNOSIS — R51 Headache: Secondary | ICD-10-CM | POA: Diagnosis not present

## 2019-02-26 DIAGNOSIS — R51 Headache: Secondary | ICD-10-CM | POA: Diagnosis not present

## 2019-02-26 DIAGNOSIS — M6281 Muscle weakness (generalized): Secondary | ICD-10-CM | POA: Diagnosis not present

## 2019-02-26 DIAGNOSIS — M2560 Stiffness of unspecified joint, not elsewhere classified: Secondary | ICD-10-CM | POA: Diagnosis not present

## 2019-02-26 DIAGNOSIS — M799 Soft tissue disorder, unspecified: Secondary | ICD-10-CM | POA: Diagnosis not present

## 2019-02-26 DIAGNOSIS — M542 Cervicalgia: Secondary | ICD-10-CM | POA: Diagnosis not present

## 2019-03-03 DIAGNOSIS — M799 Soft tissue disorder, unspecified: Secondary | ICD-10-CM | POA: Diagnosis not present

## 2019-03-03 DIAGNOSIS — M542 Cervicalgia: Secondary | ICD-10-CM | POA: Diagnosis not present

## 2019-03-03 DIAGNOSIS — R51 Headache: Secondary | ICD-10-CM | POA: Diagnosis not present

## 2019-03-03 DIAGNOSIS — M6281 Muscle weakness (generalized): Secondary | ICD-10-CM | POA: Diagnosis not present

## 2019-03-03 DIAGNOSIS — M2560 Stiffness of unspecified joint, not elsewhere classified: Secondary | ICD-10-CM | POA: Diagnosis not present

## 2019-03-05 DIAGNOSIS — M542 Cervicalgia: Secondary | ICD-10-CM | POA: Diagnosis not present

## 2019-03-10 ENCOUNTER — Ambulatory Visit
Admission: RE | Admit: 2019-03-10 | Discharge: 2019-03-10 | Disposition: A | Discharge status: Home / Self Care | Payer: Medicare Other | Source: Ambulatory Visit | Attending: Internal Medicine | Admitting: Internal Medicine

## 2019-03-10 DIAGNOSIS — E042 Nontoxic multinodular goiter: Secondary | ICD-10-CM

## 2019-03-10 IMAGING — US US THYROID
1 series · 13 of 25 positions shown · non-contrast
Comparison: [DATE];

CLINICAL DATA: Prior ultrasound follow-up. Follow-up multinodular
goiter. History of right-sided thyroid cyst aspiration performed
[DATE]. History of bilateral thyroid nodule fine-needle
aspiration performed [DATE]

EXAM:
THYROID ULTRASOUND
TECHNIQUE: Ultrasound examination of the thyroid gland and adjacent soft
tissues was performed.

[Series 1: us thyroid · 0.05mm/px · 13 of 37 slices shown]
[im 1/37]
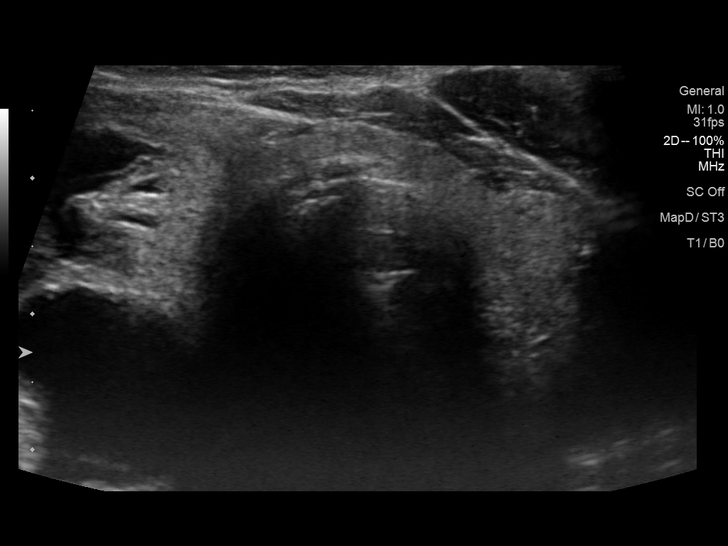
[im 4/37]
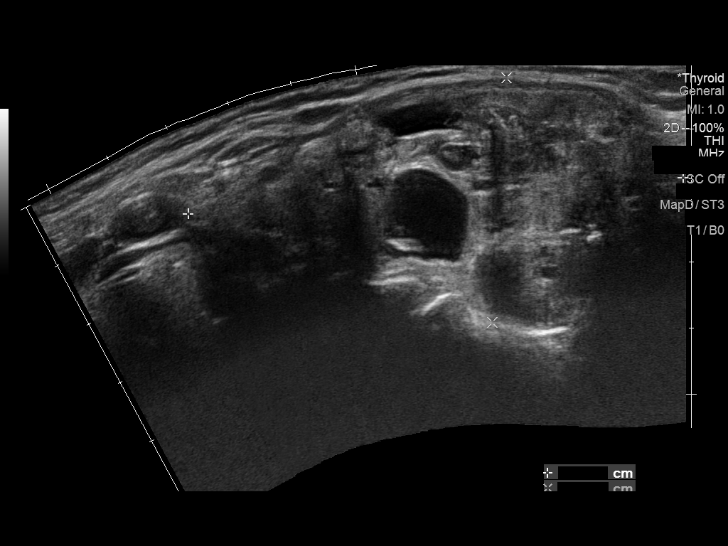
[im 7/37]
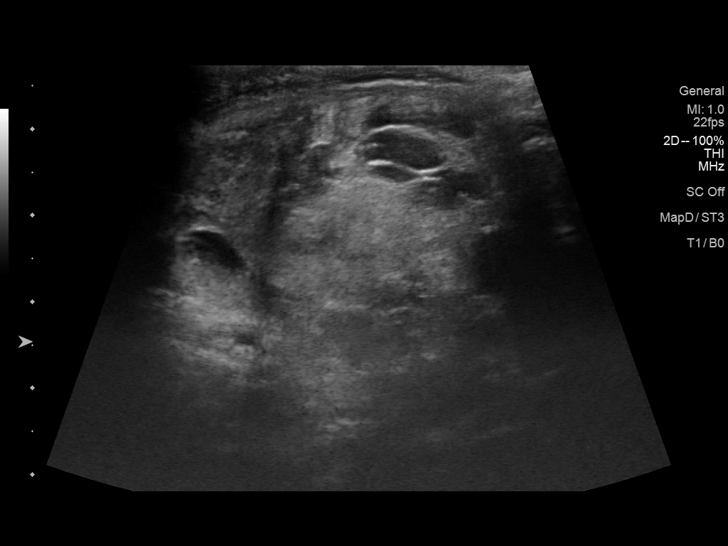
[im 10/37]
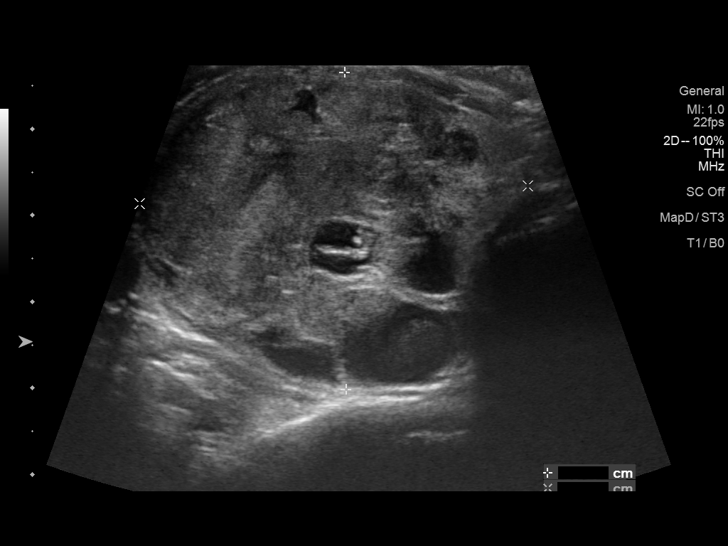
[im 13/37]
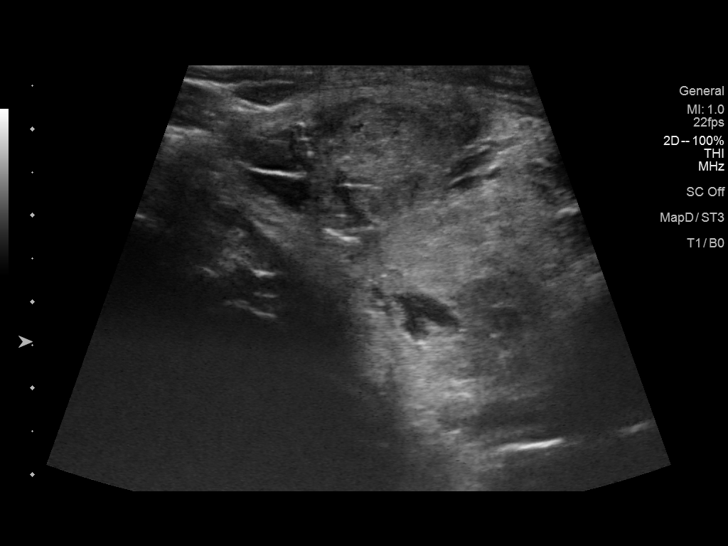
[im 16/37]
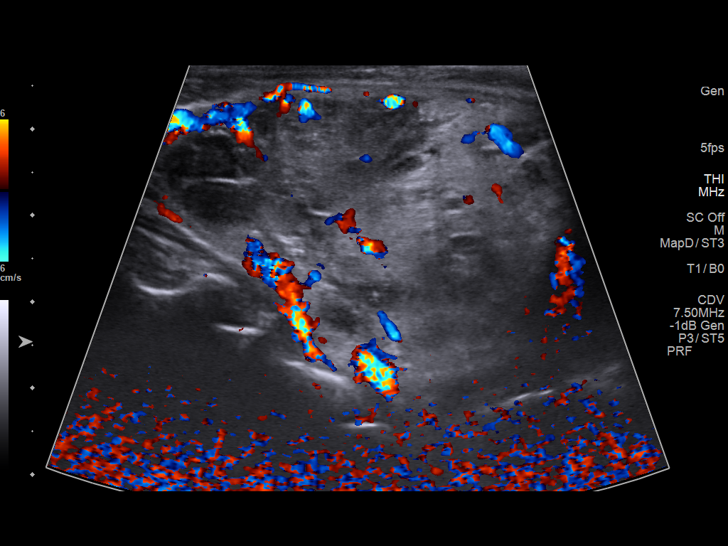
[im 19/37]
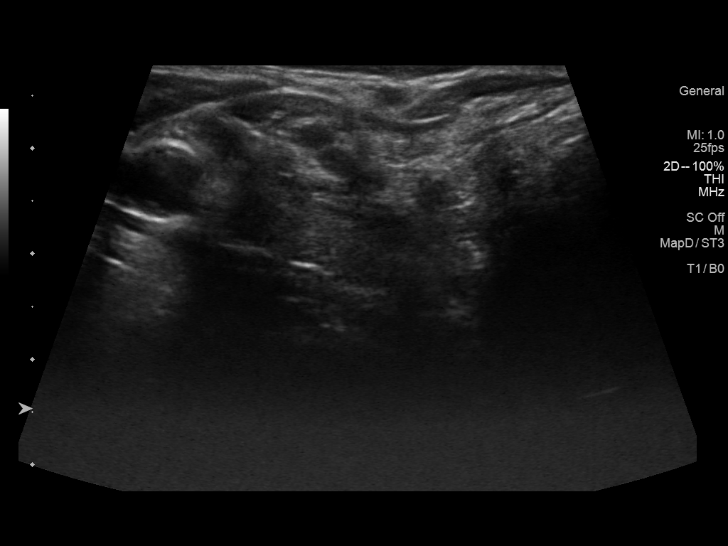
[im 22/37]
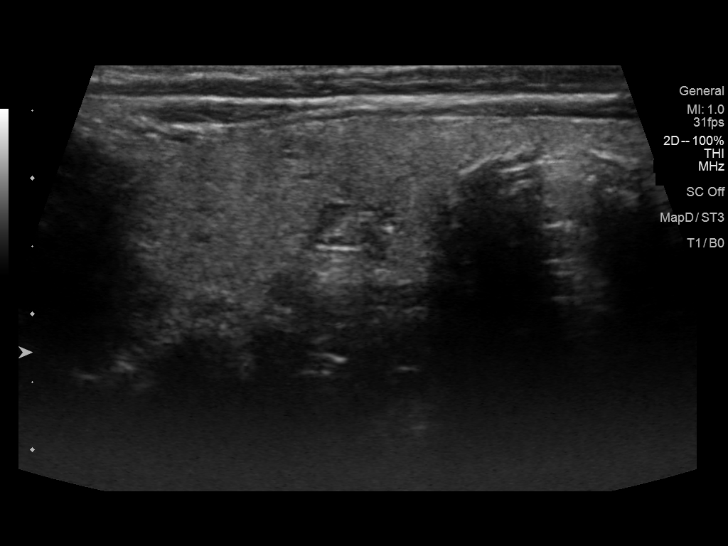
[im 25/37]
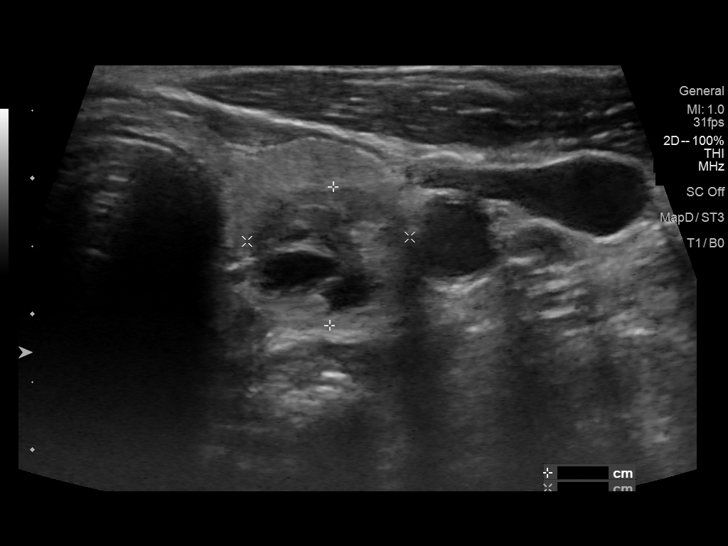
[im 28/37]
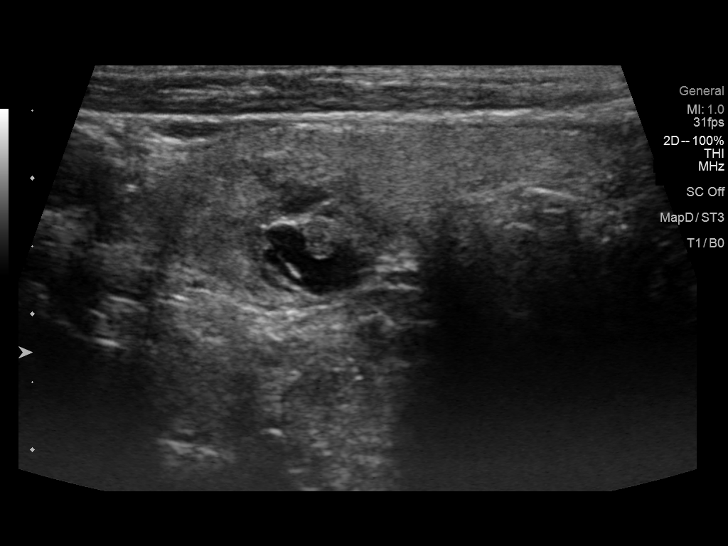
[im 31/37]
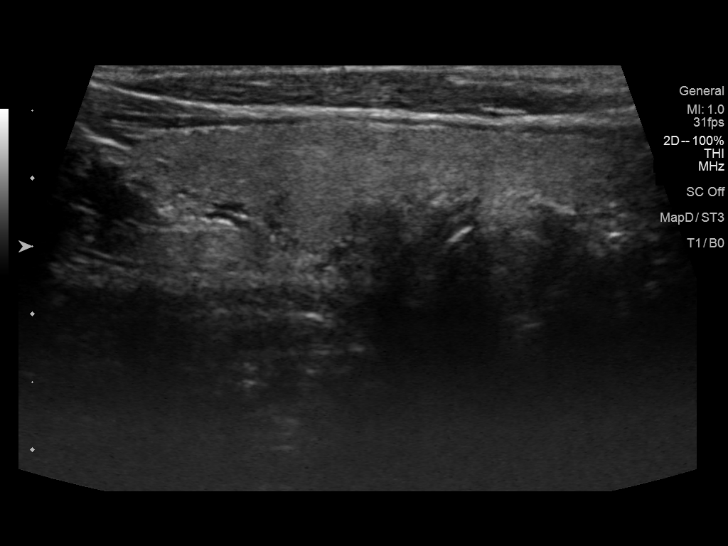
[im 34/37]
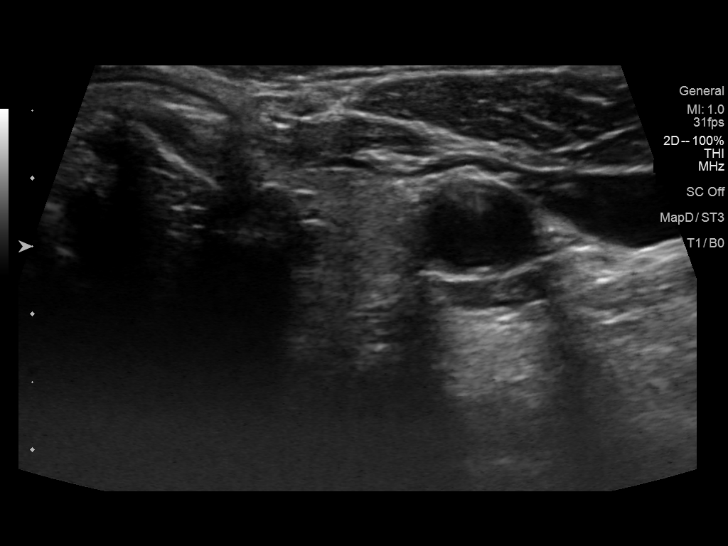
[im 37/37]
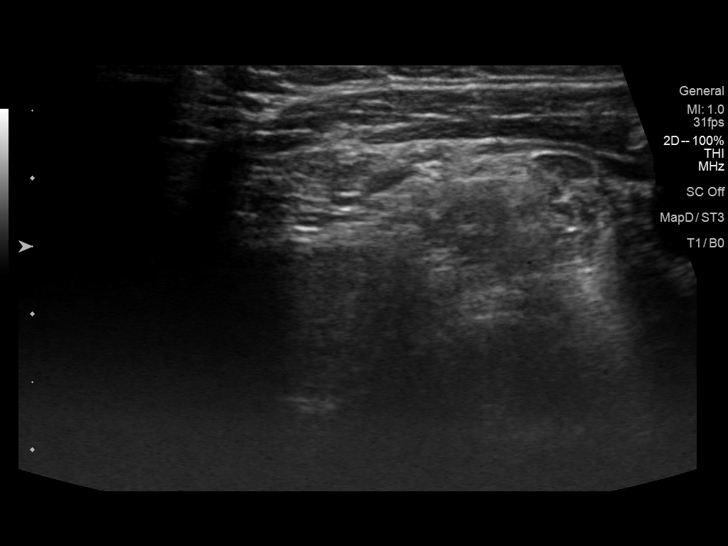

[13 of 25 positions shown; findings below may reference images not displayed]

[DATE]; [DATE]; [DATE];
[DATE]; [DATE]; ultrasound-guided right thyroid nodule
fine-needle aspiration-[DATE]; bilateral thyroid nodule
fine-needle aspiration-[DATE]
FINDINGS: Parenchymal Echotexture: Mildly heterogenous

Isthmus: Normal in size measuring 0.4 cm in diameter, unchanged

Right lobe: Enlarged measuring 7.5 x 3.7 by 4.1 cm, unchanged,
previously, 7.3 x 4.8 x 6.5 cm with differences likely attributable
to scan plane projection.

Left lobe: Normal in size measuring 5.5 x 1.8 x 1.3 cm, unchanged,
previously, 5.1 x 1.6 x 1.5 cm

_________________________________________________________

Estimated total number of nodules >/= 1 cm: 2

Number of spongiform nodules >/=  2 cm not described below (TR1): 0

Number of mixed cystic and solid nodules >/= 1.5 cm not described
below (TR2): 0

_________________________________________________________

The previously biopsied 5.1 x 4.5 x 3.7 cm mass replacing the right
lobe of the thyroid (labeled 1) appears has decreased in size
compared to the [DATE] examination, previously, 6.4 x 5.4 x 4.8 cm
though slightly enlarged compared to remote ultrasound performed
[DATE], previously, 4.1 x 1.9 x 2.7 cm with slight differences
likely attributable interval cystic degeneration, a typically benign
finding.

_________________________________________________________

Previously biopsied approximately 2.0 x 1.2 x 1.0 cm partially
cystic, partially solid nodule within the mid aspect the left lobe
of the thyroid (labeled 2), appears similar to the [DATE]
examination, previously, 1.6 x 0.9 x 0.8 cm with size differences
attributable to interval cystic degeneration, a typically benign
finding.
IMPRESSION: 1. Similar findings of multinodular goiter. No definitive worrisome
new or enlarging thyroid nodules.
2. Dominant right-sided thyroid nodule/mass is unchanged to
decreased in size compared to the [DATE] examination, currently
measuring 5.1 cm, previously, 6.4 cm.
3. Previously biopsied bilateral thyroid nodules/masses appear
similar to the [DATE] examination with slight differences
attributable to interval cystic degeneration, a typically benign
finding. Correlation with prior biopsy results is recommended.
Assuming benign pathologic diagnoses, repeat sampling and/or
continued dedicated follow-up is not recommended.

The above is in keeping with the ACR TI-RADS recommendations - [HOSPITAL] [2Z];[DATE].

## 2019-03-19 DIAGNOSIS — E042 Nontoxic multinodular goiter: Secondary | ICD-10-CM | POA: Diagnosis not present

## 2019-03-26 DIAGNOSIS — M47812 Spondylosis without myelopathy or radiculopathy, cervical region: Secondary | ICD-10-CM | POA: Diagnosis not present

## 2019-03-26 DIAGNOSIS — M542 Cervicalgia: Secondary | ICD-10-CM | POA: Diagnosis not present

## 2019-03-27 DIAGNOSIS — H2523 Age-related cataract, morgagnian type, bilateral: Secondary | ICD-10-CM | POA: Diagnosis not present

## 2019-03-27 DIAGNOSIS — H52223 Regular astigmatism, bilateral: Secondary | ICD-10-CM | POA: Diagnosis not present

## 2019-03-27 DIAGNOSIS — H5213 Myopia, bilateral: Secondary | ICD-10-CM | POA: Diagnosis not present

## 2019-03-27 DIAGNOSIS — H524 Presbyopia: Secondary | ICD-10-CM | POA: Diagnosis not present

## 2019-04-18 ENCOUNTER — Ambulatory Visit (INDEPENDENT_AMBULATORY_CARE_PROVIDER_SITE_OTHER): Payer: Medicare Other | Admitting: Podiatry

## 2019-04-18 ENCOUNTER — Other Ambulatory Visit: Payer: Self-pay

## 2019-04-18 ENCOUNTER — Encounter: Payer: Self-pay | Admitting: Podiatry

## 2019-04-18 ENCOUNTER — Ambulatory Visit (INDEPENDENT_AMBULATORY_CARE_PROVIDER_SITE_OTHER): Payer: Medicare Other

## 2019-04-18 DIAGNOSIS — M7662 Achilles tendinitis, left leg: Secondary | ICD-10-CM

## 2019-04-18 NOTE — Patient Instructions (Signed)

## 2019-04-19 NOTE — Progress Notes (Signed)
Subjective:   Patient ID: Shelby Pace, female   DOB: 68 y.o.   MRN: [id number]   HPI Patient states that she is had a lot of pain in the back of her heel for the last couple months with a knot and it hurts putting on any surface and she is been taking meloxicam that seems to help a little bit but still having a lot of pain   ROS      Objective:  Physical Exam  Neurovascular status intact with exquisite discomfort medial side left posterior heel at insertion of the Achilles tendon into the calcaneus with no central lateral pain noted currently with mild equinus noted but no muscle strength loss     Assessment:  Acute Achilles tendinitis left medial side with inflammation fluid     Plan:  H&P condition reviewed and explained.  Sterile prep and a careful injection medial side Achilles 3 mg Dexasone Kenalog 5 g Xylocaine after first explaining chances for rupture associated with injection treatment.  Did apply air fracture walker to completely immobilize and reappoint to recheck 3 weeks  X-rays indicate there is a large posterior spur with no indication of stress fracture arthritis

## 2019-05-01 ENCOUNTER — Telehealth: Payer: Self-pay | Admitting: *Deleted

## 2019-05-01 NOTE — Telephone Encounter (Signed)
Pt states she was seen 2 weeks ago by Dr. Paulla Dolly for tendonitis and put in the boot for 2 weeks but what was she to do after.

## 2019-05-02 NOTE — Telephone Encounter (Signed)
I informed pt I reviewed her last clinical note and Dr. Paulla Dolly had wanted to see her in 3 weeks, but did not note that he wanted her to transfer from the stabilizing boot to another type of shoe gear. I told pt Dr. Paulla Dolly would return Monday and she should remain in the stabilizing boot and I would call again with his instructions.

## 2019-05-05 NOTE — Telephone Encounter (Signed)
I should be seeing her soon. She should try to reduce the boot as long as pain is under control

## 2019-05-05 NOTE — Telephone Encounter (Signed)
I informed Shelby Pace of Dr. Mellody Drown recommendation and instructed Shelby Pace that she may want to wear a regular shoe around the house for short distance, so that if she felt she was becoming uncomfortable she could quickly go back in to the boot, and definitely wear the boot when out of the house or at long distance. Shelby Pace states understanding.

## 2019-05-16 ENCOUNTER — Other Ambulatory Visit: Payer: Self-pay

## 2019-05-16 ENCOUNTER — Encounter: Payer: Self-pay | Admitting: Podiatry

## 2019-05-16 ENCOUNTER — Ambulatory Visit (INDEPENDENT_AMBULATORY_CARE_PROVIDER_SITE_OTHER): Payer: Medicare Other | Admitting: Podiatry

## 2019-05-16 DIAGNOSIS — M7662 Achilles tendinitis, left leg: Secondary | ICD-10-CM

## 2019-05-18 NOTE — Progress Notes (Signed)
Subjective:   Patient ID: Shelby Pace, female   DOB: 68 y.o.   MRN: [id number]   HPI Patient presents stating have a lot of pain in the back of the left heel and states that it seems like it got better for a week or 2 and now has come back and boot itself is irritated   ROS      Objective:  Physical Exam  Neurovascular status intact with patient noted to having posterior insertional pain left heel at the insertion calcaneus with moderate equinus condition also noted     Assessment:  Achilles tendinitis left which is failed to respond conservatively so far     Plan:  H&P condition reviewed and we discussed possibility for shockwave in future but at this time I am sending for physical therapy along with continued immobilization and open back shoes.  Reappoint 4 weeks and we will consider other treatments which may be necessary

## 2019-05-26 DIAGNOSIS — M25672 Stiffness of left ankle, not elsewhere classified: Secondary | ICD-10-CM | POA: Diagnosis not present

## 2019-05-26 DIAGNOSIS — R262 Difficulty in walking, not elsewhere classified: Secondary | ICD-10-CM | POA: Diagnosis not present

## 2019-05-26 DIAGNOSIS — M799 Soft tissue disorder, unspecified: Secondary | ICD-10-CM | POA: Diagnosis not present

## 2019-05-26 DIAGNOSIS — M25572 Pain in left ankle and joints of left foot: Secondary | ICD-10-CM | POA: Diagnosis not present

## 2019-05-28 DIAGNOSIS — R262 Difficulty in walking, not elsewhere classified: Secondary | ICD-10-CM | POA: Diagnosis not present

## 2019-05-28 DIAGNOSIS — M799 Soft tissue disorder, unspecified: Secondary | ICD-10-CM | POA: Diagnosis not present

## 2019-05-28 DIAGNOSIS — M25572 Pain in left ankle and joints of left foot: Secondary | ICD-10-CM | POA: Diagnosis not present

## 2019-05-28 DIAGNOSIS — M25672 Stiffness of left ankle, not elsewhere classified: Secondary | ICD-10-CM | POA: Diagnosis not present

## 2019-06-02 DIAGNOSIS — M25572 Pain in left ankle and joints of left foot: Secondary | ICD-10-CM | POA: Diagnosis not present

## 2019-06-02 DIAGNOSIS — M799 Soft tissue disorder, unspecified: Secondary | ICD-10-CM | POA: Diagnosis not present

## 2019-06-02 DIAGNOSIS — M25672 Stiffness of left ankle, not elsewhere classified: Secondary | ICD-10-CM | POA: Diagnosis not present

## 2019-06-02 DIAGNOSIS — R262 Difficulty in walking, not elsewhere classified: Secondary | ICD-10-CM | POA: Diagnosis not present

## 2019-06-04 DIAGNOSIS — R262 Difficulty in walking, not elsewhere classified: Secondary | ICD-10-CM | POA: Diagnosis not present

## 2019-06-04 DIAGNOSIS — M25572 Pain in left ankle and joints of left foot: Secondary | ICD-10-CM | POA: Diagnosis not present

## 2019-06-04 DIAGNOSIS — M25672 Stiffness of left ankle, not elsewhere classified: Secondary | ICD-10-CM | POA: Diagnosis not present

## 2019-06-04 DIAGNOSIS — M799 Soft tissue disorder, unspecified: Secondary | ICD-10-CM | POA: Diagnosis not present

## 2019-06-09 DIAGNOSIS — R262 Difficulty in walking, not elsewhere classified: Secondary | ICD-10-CM | POA: Diagnosis not present

## 2019-06-09 DIAGNOSIS — M25572 Pain in left ankle and joints of left foot: Secondary | ICD-10-CM | POA: Diagnosis not present

## 2019-06-09 DIAGNOSIS — M799 Soft tissue disorder, unspecified: Secondary | ICD-10-CM | POA: Diagnosis not present

## 2019-06-09 DIAGNOSIS — M25672 Stiffness of left ankle, not elsewhere classified: Secondary | ICD-10-CM | POA: Diagnosis not present

## 2019-06-16 ENCOUNTER — Other Ambulatory Visit: Payer: Self-pay

## 2019-06-16 ENCOUNTER — Encounter: Payer: Self-pay | Admitting: Podiatry

## 2019-06-16 ENCOUNTER — Ambulatory Visit (INDEPENDENT_AMBULATORY_CARE_PROVIDER_SITE_OTHER): Payer: Medicare Other | Admitting: Podiatry

## 2019-06-16 DIAGNOSIS — M7662 Achilles tendinitis, left leg: Secondary | ICD-10-CM | POA: Diagnosis not present

## 2019-06-23 NOTE — Progress Notes (Signed)
Subjective:   Patient ID: Shelby Pace, female   DOB: 68 y.o.   MRN: [id number]   HPI Patient states feeling much better and states physical therapy is making a difference   ROS      Objective:  Physical Exam  Neurovascular status intact with patient's left posterior heel improved with pain still present only upon deep palpation but a lot better     Assessment:  Acute Achilles tendinitis left which appears to be gradually improving     Plan:  Spent a great deal of time educating her on this and discussing different options for the future with the possibility for shockwave therapy or other treatments depending on how it continues to progress.  Patient will finish up physical therapy will be seen back as needed and will see me if any issues were to occur

## 2019-08-13 DIAGNOSIS — M47812 Spondylosis without myelopathy or radiculopathy, cervical region: Secondary | ICD-10-CM | POA: Diagnosis not present

## 2019-08-13 DIAGNOSIS — M4726 Other spondylosis with radiculopathy, lumbar region: Secondary | ICD-10-CM | POA: Diagnosis not present

## 2019-08-18 ENCOUNTER — Other Ambulatory Visit: Payer: Self-pay | Admitting: Family Medicine

## 2019-08-18 DIAGNOSIS — R519 Headache, unspecified: Secondary | ICD-10-CM

## 2019-08-18 DIAGNOSIS — M47812 Spondylosis without myelopathy or radiculopathy, cervical region: Secondary | ICD-10-CM

## 2019-09-02 DIAGNOSIS — Z1231 Encounter for screening mammogram for malignant neoplasm of breast: Secondary | ICD-10-CM | POA: Diagnosis not present

## 2019-09-07 ENCOUNTER — Ambulatory Visit: Payer: Medicare Other

## 2019-09-15 ENCOUNTER — Ambulatory Visit
Admission: RE | Admit: 2019-09-15 | Discharge: 2019-09-15 | Disposition: A | Discharge status: Home / Self Care | Payer: Medicare Other | Source: Ambulatory Visit | Attending: Family Medicine | Admitting: Family Medicine

## 2019-09-15 DIAGNOSIS — M50221 Other cervical disc displacement at C4-C5 level: Secondary | ICD-10-CM | POA: Diagnosis not present

## 2019-09-15 DIAGNOSIS — R519 Headache, unspecified: Secondary | ICD-10-CM

## 2019-09-15 DIAGNOSIS — M5481 Occipital neuralgia: Secondary | ICD-10-CM | POA: Diagnosis not present

## 2019-09-15 DIAGNOSIS — M5021 Other cervical disc displacement,  high cervical region: Secondary | ICD-10-CM | POA: Diagnosis not present

## 2019-09-15 DIAGNOSIS — M50223 Other cervical disc displacement at C6-C7 level: Secondary | ICD-10-CM | POA: Diagnosis not present

## 2019-09-15 DIAGNOSIS — M50222 Other cervical disc displacement at C5-C6 level: Secondary | ICD-10-CM | POA: Diagnosis not present

## 2019-09-15 DIAGNOSIS — M47812 Spondylosis without myelopathy or radiculopathy, cervical region: Secondary | ICD-10-CM

## 2019-09-15 DIAGNOSIS — M5030 Other cervical disc degeneration, unspecified cervical region: Secondary | ICD-10-CM | POA: Diagnosis not present

## 2019-09-15 IMAGING — MR MR CERVICAL SPINE WO/W CM
5 of 8 series · 26 of 48 positions shown · IV contrast (15 ml multihance)
Comparison: None.

CLINICAL DATA: Neck and occipital pain for 1 year.

Creatinine was obtained on site at [HOSPITAL] at [HOSPITAL].
Results: Creatinine 0.7 mg/dL.
EXAM:
MRI HEAD WITHOUT AND WITH CONTRAST
MRI CERVICAL SPINE WITHOUT AND WITH CONTRAST
TECHNIQUE: Multiplanar, multiecho pulse sequences of the brain and surrounding
structures, and cervical spine, to include the craniocervical
junction and cervicothoracic junction, were obtained without and
with intravenous contrast.
CONTRAST:  15mL MULTIHANCE GADOBENATE DIMEGLUMINE 529 MG/ML IV SOLN

[Series 5: T1 · sagittal · 3.0mm · 0.66mm/px · 3 of 15 slices shown (1 of 3)]
[im 1/15]
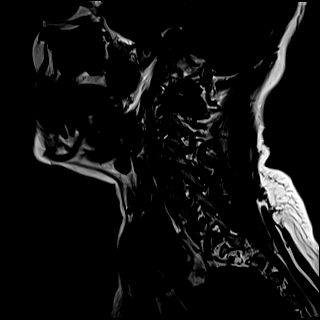
[im 8/15]
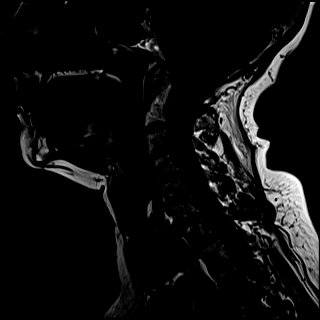
[im 15/15]
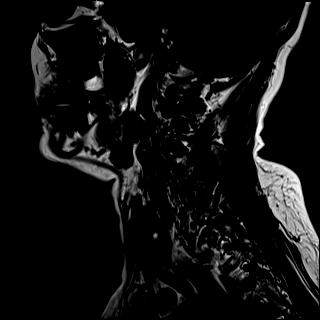

[Series 7: T2 · axial · 3.0mm · 0.50mm/px · z∈[-212,-120]mm · 8 of 32 slices shown (1 of 2)]
[im 1/32]
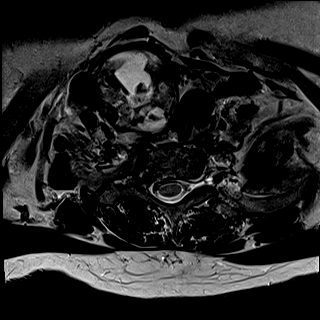
[im 5/32]
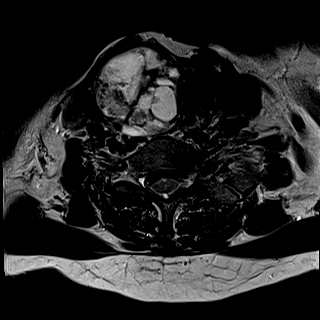
[im 9/32]
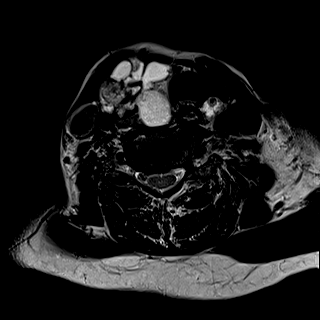
[im 14/32]
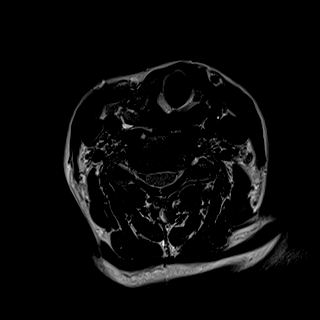
[im 18/32]
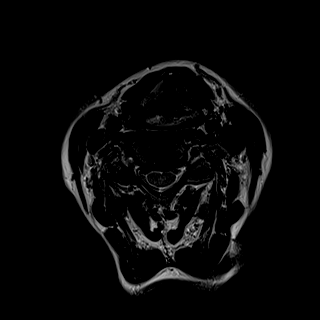
[im 23/32]
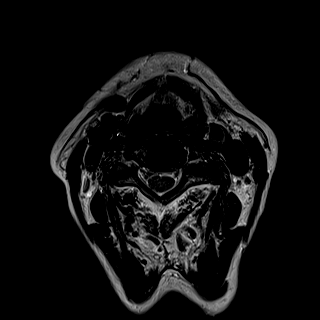
[im 27/32]
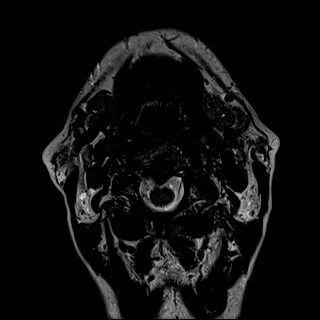
[im 32/32]
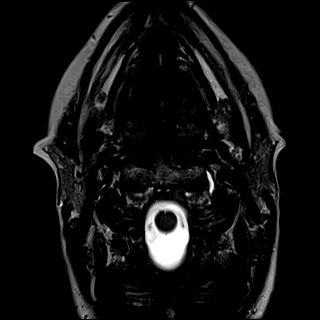

[Series 9: T1 · axial · non-contrast · 3.0mm · 0.31mm/px · z∈[-212,-120]mm · 8 of 32 slices shown (2 of 3)]
[im 1/32]
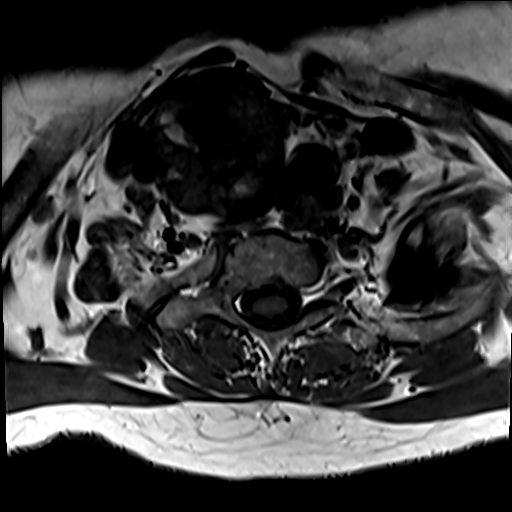
[im 5/32]
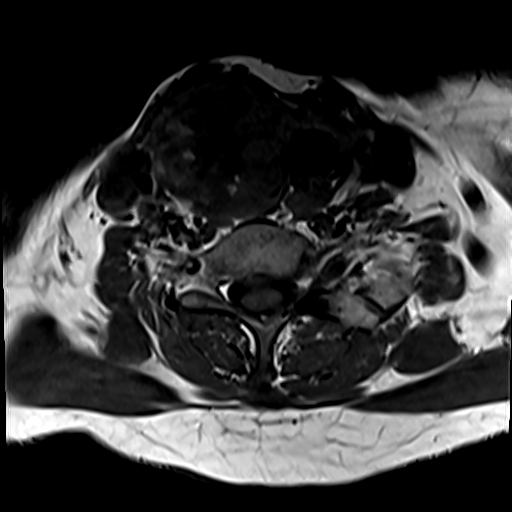
[im 9/32]
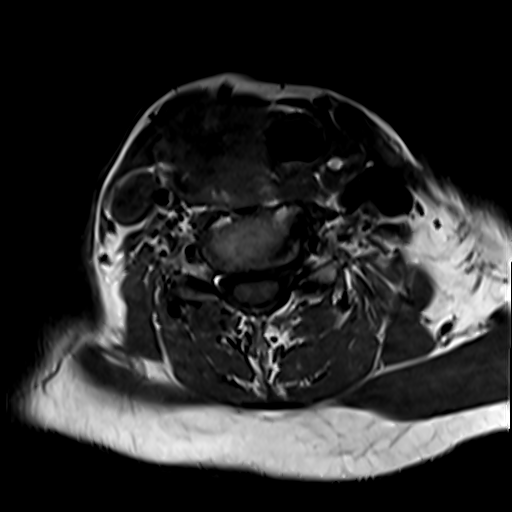
[im 14/32]
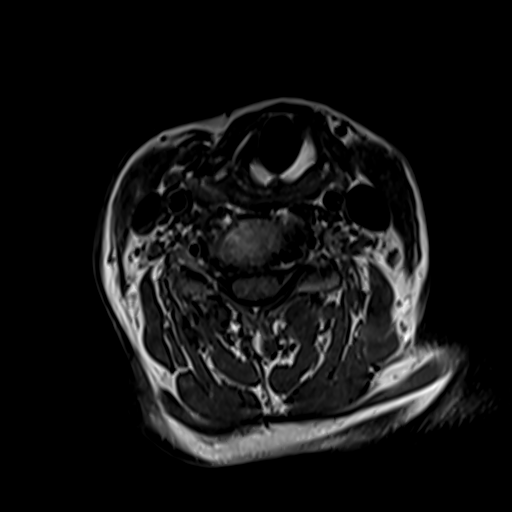
[im 18/32]
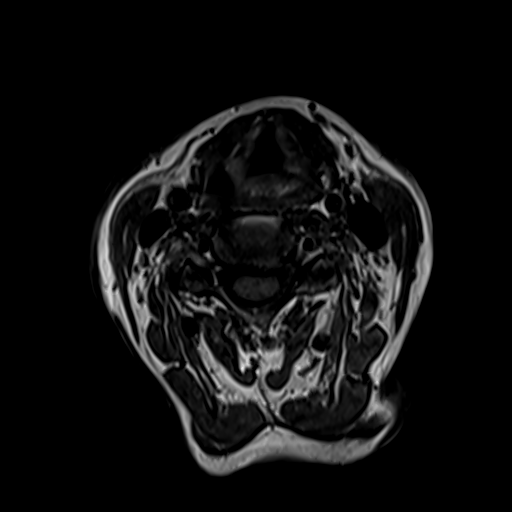
[im 23/32]
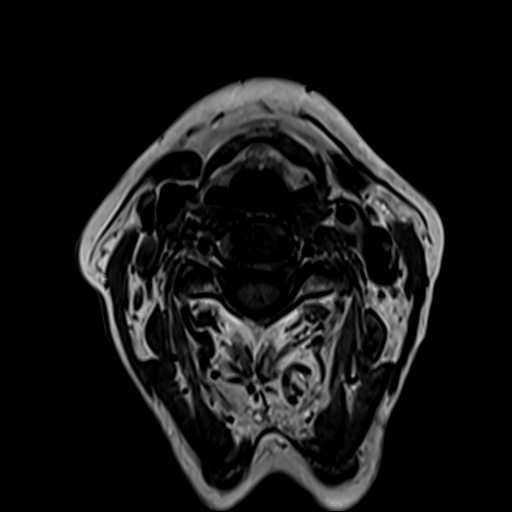
[im 27/32]
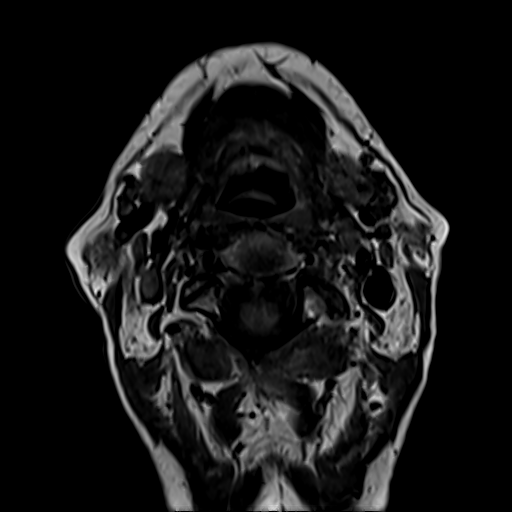
[im 32/32]
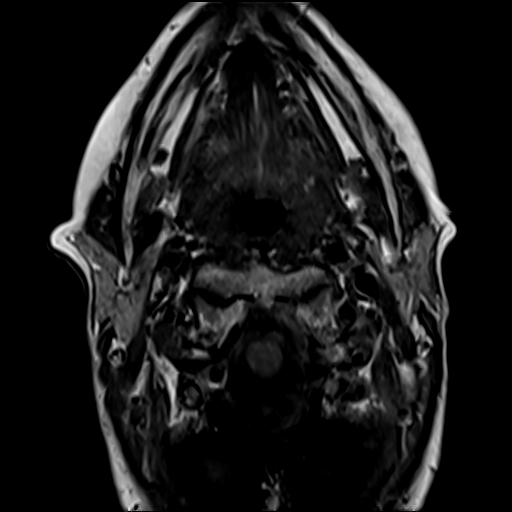

[Series 10: T2 · sagittal · 3.0mm · 0.55mm/px · 4 of 15 slices shown (2 of 2)]
[im 1/15]
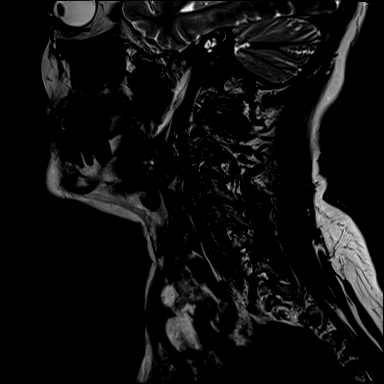
[im 5/15]
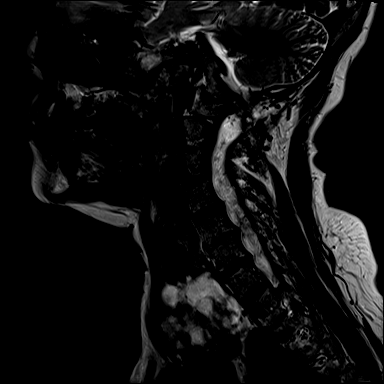
[im 10/15]
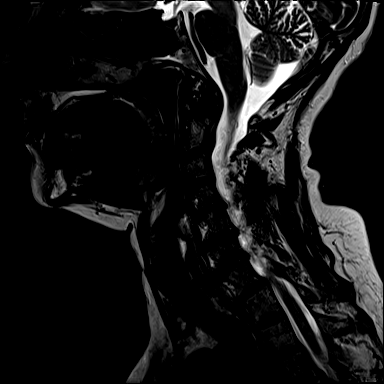
[im 15/15]
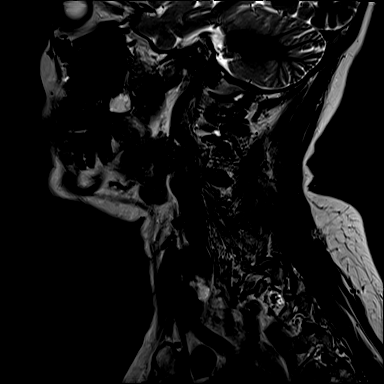

[Series 12: T1 · axial · 3.0mm · 0.31mm/px · z∈[-225,-201]mm · 3 of 36 slices shown (3 of 3)]
[im 1/36]
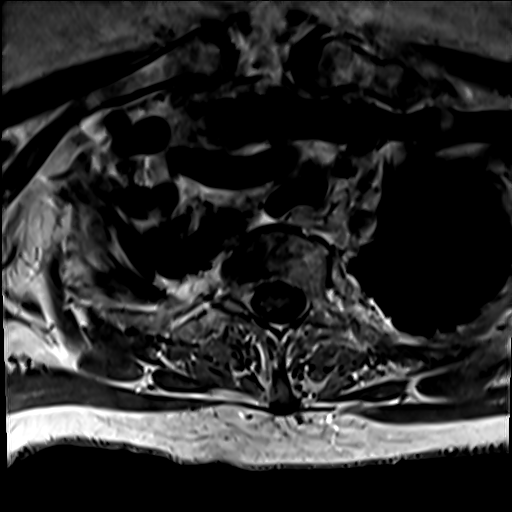
[im 5/36]
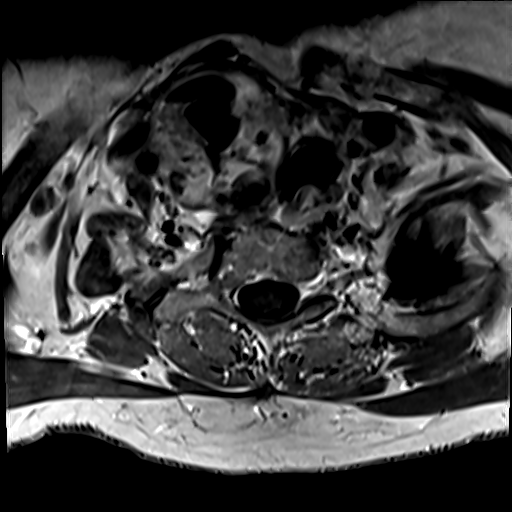
[im 9/36]
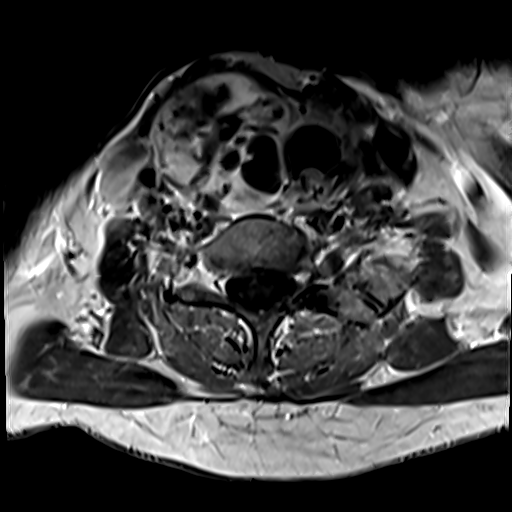

[26 of 48 positions shown; findings below may reference images not displayed]

FINDINGS: MRI HEAD FINDINGS

Brain: No acute infarct, acute hemorrhage or extra-axial collection.
Mild white matter hyperintensity, most commonly due to chronic
ischemic microangiopathy, though not unexpected for age. Normal
volume of CSF spaces. No chronic microhemorrhage. Normal midline
structures. There is an 8 mm meningioma along the right convexity.

Vascular: Normal flow voids.

Skull and upper cervical spine: Normal marrow signal.

Sinuses/Orbits: Negative.

Other: None.

MRI CERVICAL SPINE FINDINGS

Alignment: Grade 1 anterolisthesis at C7-T1.

Vertebrae: No fracture, evidence of discitis, or bone lesion.

Cord: Normal signal and morphology.

Posterior Fossa, vertebral arteries, paraspinal tissues: Enlarged,
heterogeneous right thyroid lobe.

Disc levels:

C1-C2: Unremarkable.

C2-C3: Small central disc protrusion. Mild left facet hypertrophy.
There is no spinal canal stenosis. No neural foraminal stenosis.

C3-C4: Small disc bulge. Mild left facet hypertrophy. There is no
spinal canal stenosis. No neural foraminal stenosis.

C4-C5: Small left subarticular disc protrusion. Bilateral
uncovertebral hypertrophy. Moderate right facet hypertrophy. There
is no spinal canal stenosis. Mild right neural foraminal stenosis.

C5-C6: Small disc bulge with bilateral uncovertebral hypertrophy.
There is no spinal canal stenosis. Mild right and moderate left
neural foraminal stenosis.

C6-C7: Left uncovertebral hypertrophy and mild disc bulge. There is
no spinal canal stenosis. Moderate left neural foraminal stenosis.

C7-T1: Grade 1 anterolisthesis with disc uncovering. There is no
spinal canal stenosis. No neural foraminal stenosis.
IMPRESSION: 1. No acute intracranial abnormality. Mild chronic ischemic white
matter changes.
2. Incidental 8 mm right convexity meningioma.
3. Moderate left C5-6 and left C6-7 neural foraminal stenosis.
4. Multilevel cervical degenerative disc disease without spinal
canal stenosis.
5. Enlarged, heterogeneous right thyroid lobe. This has previously
been imaged by ultrasound.

## 2019-09-15 IMAGING — MR MR HEAD WO/W CM
12 series · 48 of 48 positions shown · IV contrast (multihance)
Comparison: None.

CLINICAL DATA: Neck and occipital pain for 1 year.

Creatinine was obtained on site at [HOSPITAL] at [HOSPITAL].
Results: Creatinine 0.7 mg/dL.
EXAM:
MRI HEAD WITHOUT AND WITH CONTRAST
MRI CERVICAL SPINE WITHOUT AND WITH CONTRAST
TECHNIQUE: Multiplanar, multiecho pulse sequences of the brain and surrounding
structures, and cervical spine, to include the craniocervical
junction and cervicothoracic junction, were obtained without and
with intravenous contrast.
CONTRAST:  15mL MULTIHANCE GADOBENATE DIMEGLUMINE 529 MG/ML IV SOLN

[Series 5: T1 · sagittal · 4.0mm · 0.75mm/px · 1 of 31 slices shown (1 of 3)]
[im 1/31]
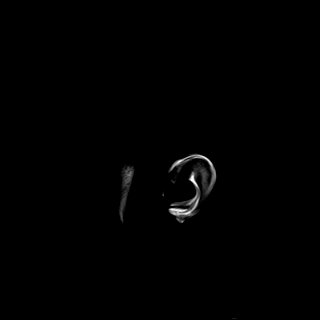

[Series 6: DWI · axial · 3.0mm · 1.44mm/px · z∈[-60,+75]mm · 4 of 84 slices shown (1 of 4)]
[im 1/84]
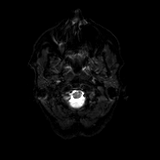
[im 28/84]
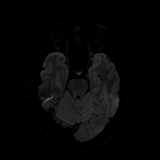
[im 56/84]
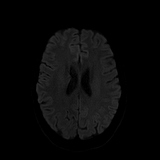
[im 84/84]
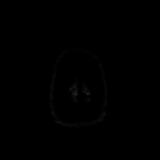

[Series 7: DWI · axial · 3.0mm · 1.44mm/px · z∈[-60,+75]mm · 3 of 42 slices shown (2 of 4)]
[im 1/42]
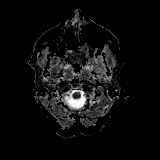
[im 21/42]
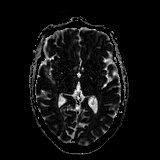
[im 42/42]
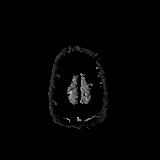

[Series 8: DWI · coronal · 5.0mm · 1.44mm/px · 4 of 60 slices shown (3 of 4)]
[im 1/60]
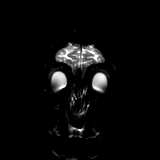
[im 20/60]
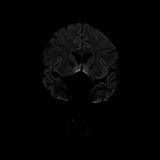
[im 40/60]
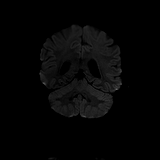
[im 60/60]
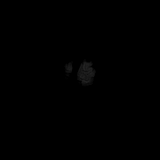

[Series 9: DWI · coronal · 5.0mm · 1.44mm/px · 2 of 30 slices shown (4 of 4)]
[im 1/30]
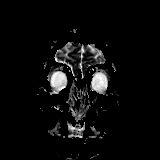
[im 30/30]
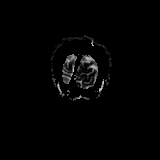

[Series 10: T2 · axial · 4.0mm · 0.36mm/px · z∈[-62,+73]mm · 2 of 27 slices shown (1 of 2)]
[im 1/27]
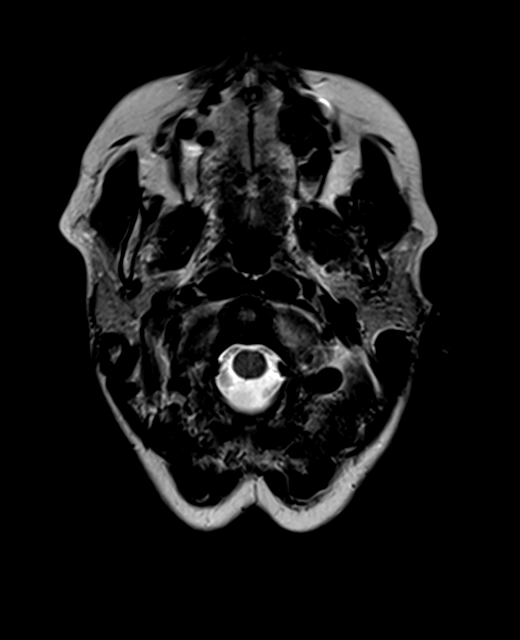
[im 27/27]
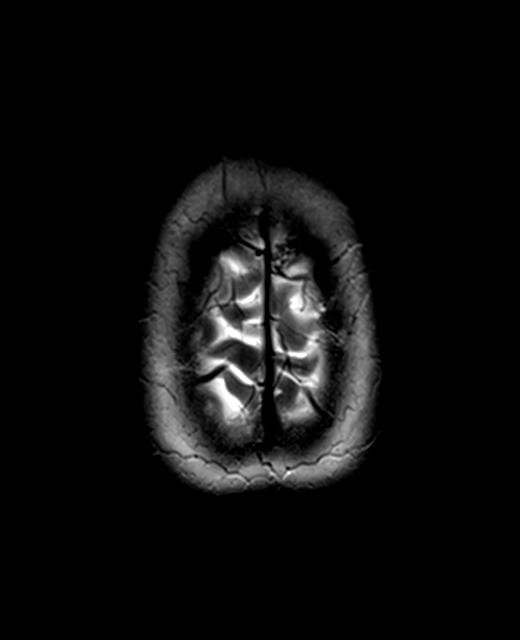

[Series 11: FLAIR · axial · 3.0mm · 0.72mm/px · z∈[-70,+80]mm · 2 of 26 slices shown]
[im 1/26]
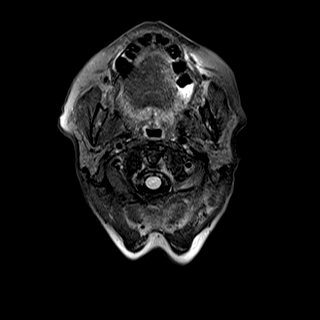
[im 26/26]
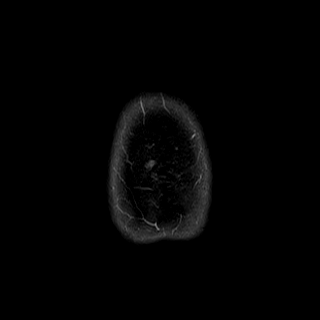

[Series 13: swi_images · axial · 1.5mm · 0.90mm/px · z∈[-65,+77]mm · 6 of 96 slices shown]
[im 1/96]
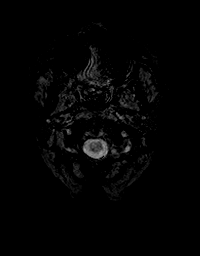
[im 20/96]
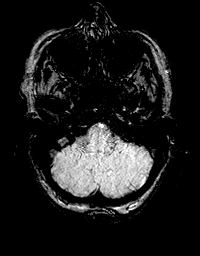
[im 39/96]
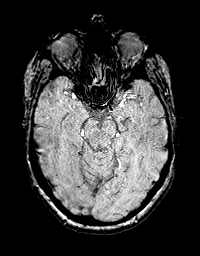
[im 58/96]
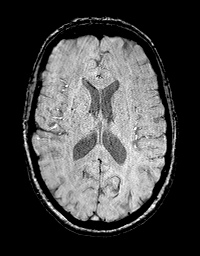
[im 77/96]
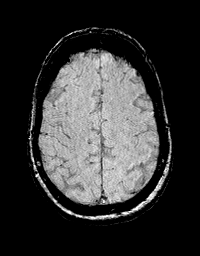
[im 96/96]
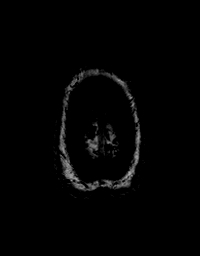

[Series 14: T1 · axial · 1.0mm · 0.94mm/px · z∈[-73,+86]mm · 10 of 160 slices shown (2 of 3)]
[im 1/160]
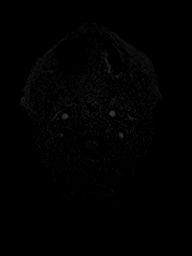
[im 18/160]
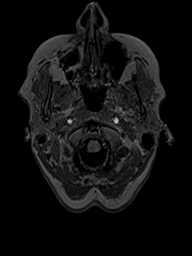
[im 36/160]
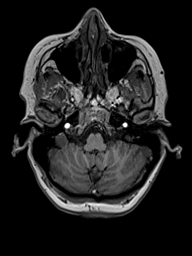
[im 54/160]
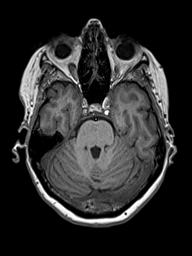
[im 71/160]
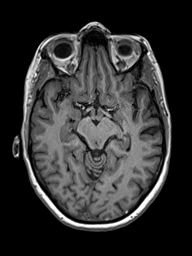
[im 89/160]
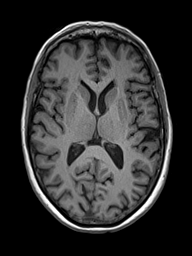
[im 107/160]
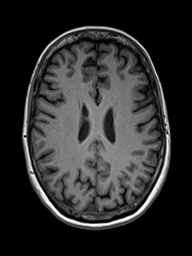
[im 124/160]
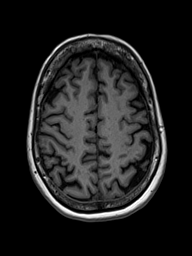
[im 142/160]
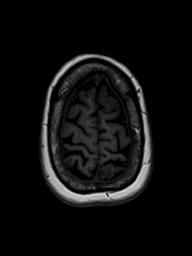
[im 160/160]
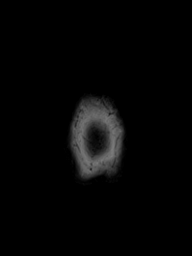

[Series 15: T2 · coronal · 4.0mm · 0.36mm/px · 2 of 35 slices shown (2 of 2)]
[im 1/35]
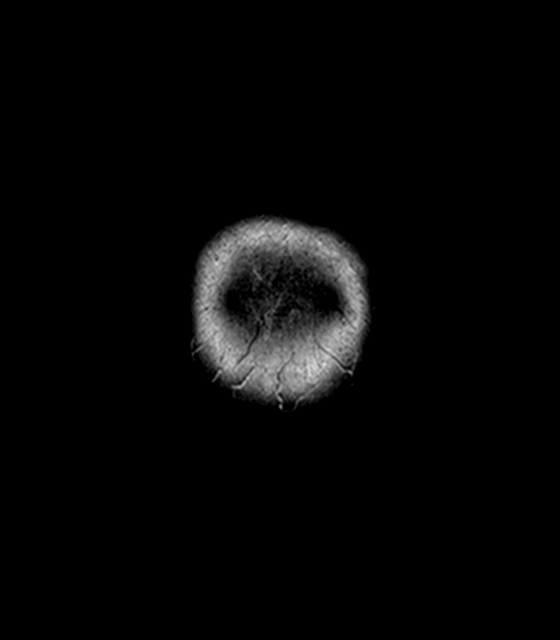
[im 35/35]
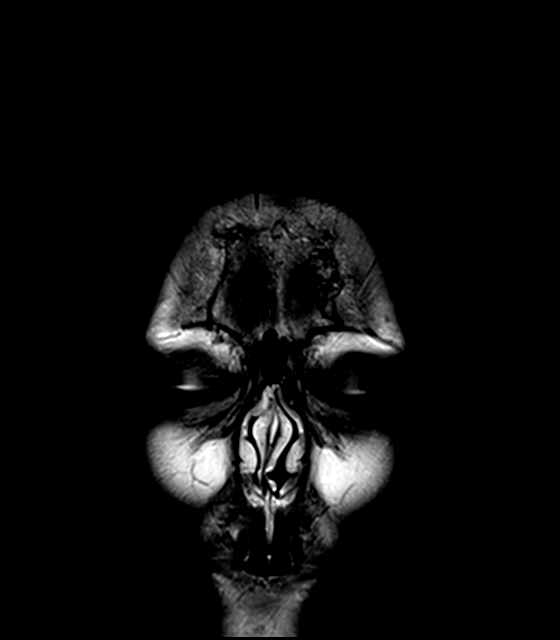

[Series 16: T1 · axial · 1.0mm · 0.94mm/px · z∈[-73,+86]mm · 10 of 160 slices shown (3 of 3)]
[im 1/160]
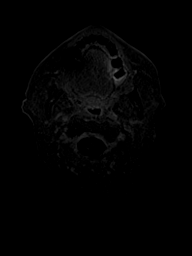
[im 18/160]
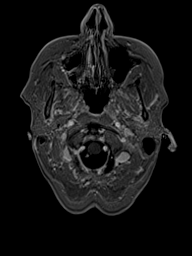
[im 36/160]
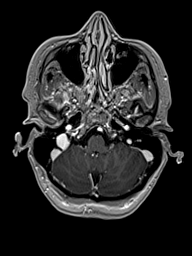
[im 54/160]
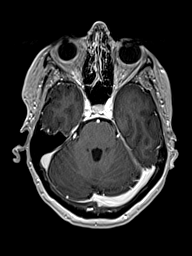
[im 71/160]
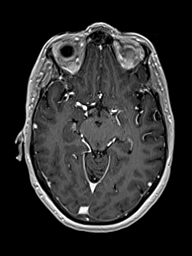
[im 89/160]
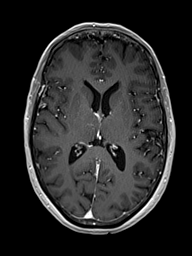
[im 107/160]
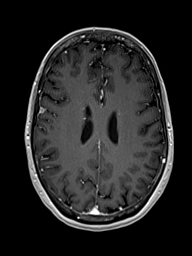
[im 124/160]
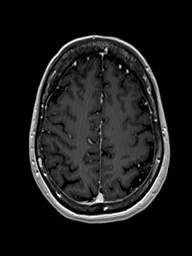
[im 142/160]
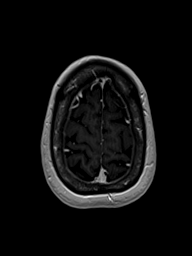
[im 160/160]
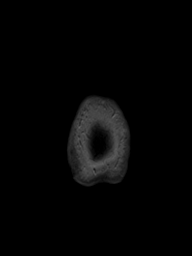

[Series 17: T1 post-contrast · coronal · 4.0mm · 0.72mm/px · 2 of 35 slices shown]
[im 1/35]
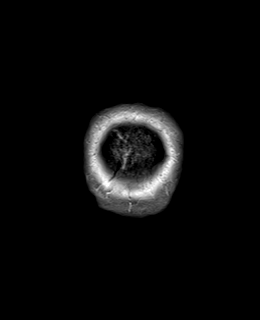
[im 35/35]
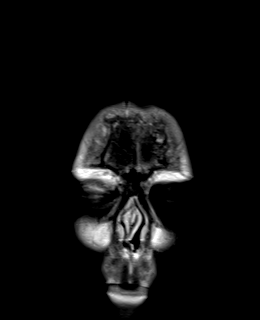

[48 of 48 positions shown; findings below may reference images not displayed]

FINDINGS: MRI HEAD FINDINGS

Brain: No acute infarct, acute hemorrhage or extra-axial collection.
Mild white matter hyperintensity, most commonly due to chronic
ischemic microangiopathy, though not unexpected for age. Normal
volume of CSF spaces. No chronic microhemorrhage. Normal midline
structures. There is an 8 mm meningioma along the right convexity.

Vascular: Normal flow voids.

Skull and upper cervical spine: Normal marrow signal.

Sinuses/Orbits: Negative.

Other: None.

MRI CERVICAL SPINE FINDINGS

Alignment: Grade 1 anterolisthesis at C7-T1.

Vertebrae: No fracture, evidence of discitis, or bone lesion.

Cord: Normal signal and morphology.

Posterior Fossa, vertebral arteries, paraspinal tissues: Enlarged,
heterogeneous right thyroid lobe.

Disc levels:

C1-C2: Unremarkable.

C2-C3: Small central disc protrusion. Mild left facet hypertrophy.
There is no spinal canal stenosis. No neural foraminal stenosis.

C3-C4: Small disc bulge. Mild left facet hypertrophy. There is no
spinal canal stenosis. No neural foraminal stenosis.

C4-C5: Small left subarticular disc protrusion. Bilateral
uncovertebral hypertrophy. Moderate right facet hypertrophy. There
is no spinal canal stenosis. Mild right neural foraminal stenosis.

C5-C6: Small disc bulge with bilateral uncovertebral hypertrophy.
There is no spinal canal stenosis. Mild right and moderate left
neural foraminal stenosis.

C6-C7: Left uncovertebral hypertrophy and mild disc bulge. There is
no spinal canal stenosis. Moderate left neural foraminal stenosis.

C7-T1: Grade 1 anterolisthesis with disc uncovering. There is no
spinal canal stenosis. No neural foraminal stenosis.
IMPRESSION: 1. No acute intracranial abnormality. Mild chronic ischemic white
matter changes.
2. Incidental 8 mm right convexity meningioma.
3. Moderate left C5-6 and left C6-7 neural foraminal stenosis.
4. Multilevel cervical degenerative disc disease without spinal
canal stenosis.
5. Enlarged, heterogeneous right thyroid lobe. This has previously
been imaged by ultrasound.

## 2019-09-15 MED ORDER — GADOBENATE DIMEGLUMINE 529 MG/ML IV SOLN
15.0000 mL | Freq: Once | INTRAVENOUS | Status: AC | PRN
  Administered 2019-09-15: 15 mL via INTRAVENOUS

## 2019-09-18 ENCOUNTER — Ambulatory Visit: Payer: Medicare Other

## 2019-09-27 ENCOUNTER — Other Ambulatory Visit: Payer: Self-pay | Admitting: Family Medicine

## 2019-09-27 DIAGNOSIS — D329 Benign neoplasm of meninges, unspecified: Secondary | ICD-10-CM

## 2020-01-13 ENCOUNTER — Other Ambulatory Visit: Payer: Self-pay

## 2020-01-13 ENCOUNTER — Ambulatory Visit
Admission: RE | Admit: 2020-01-13 | Discharge: 2020-01-13 | Disposition: A | Discharge status: Home / Self Care | Payer: Medicare Other | Source: Ambulatory Visit | Attending: Family Medicine | Admitting: Family Medicine

## 2020-01-13 DIAGNOSIS — D329 Benign neoplasm of meninges, unspecified: Secondary | ICD-10-CM

## 2020-01-13 IMAGING — MR MR HEAD WO/W CM
13 series · 48 of 48 positions shown · IV contrast (multihance)
Comparison: MRI of the brain [DATE].

CLINICAL DATA: Follow-up of meningioma.

EXAM:
MRI HEAD WITHOUT AND WITH CONTRAST
TECHNIQUE: Multiplanar, multiecho pulse sequences of the brain and surrounding
structures were obtained without and with intravenous contrast.
CONTRAST:  16mL MULTIHANCE GADOBENATE DIMEGLUMINE 529 MG/ML IV SOLN

[Series 2: T1 · sagittal · 5.0mm · 0.45mm/px · 2 of 22 slices shown]
[im 1/22]
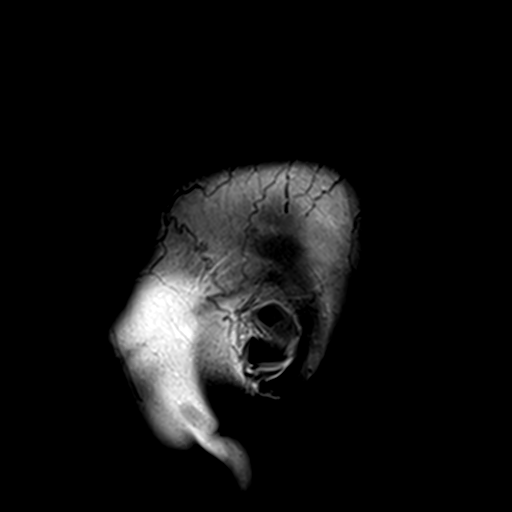
[im 22/22]
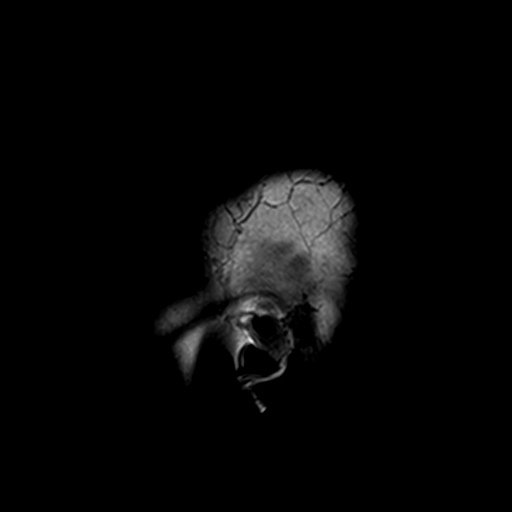

[Series 3: DWI · axial · 3.0mm · 1.80mm/px · z∈[-45,+102]mm · 7 of 100 slices shown (1 of 4)]
[im 1/100]
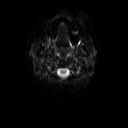
[im 17/100]
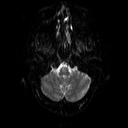
[im 34/100]
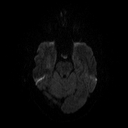
[im 50/100]
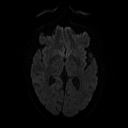
[im 67/100]
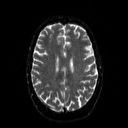
[im 83/100]
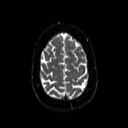
[im 100/100]
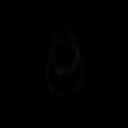

[Series 4: DWI · axial · 3.0mm · 1.80mm/px · z∈[-45,+102]mm · 3 of 50 slices shown (2 of 4)]
[im 1/50]
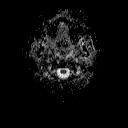
[im 25/50]
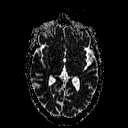
[im 50/50]
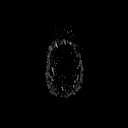

[Series 5: DWI · coronal · 5.0mm · 1.80mm/px · 5 of 70 slices shown (3 of 4)]
[im 1/70]
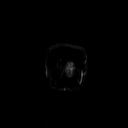
[im 18/70]
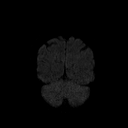
[im 35/70]
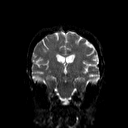
[im 52/70]
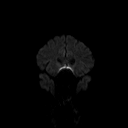
[im 70/70]
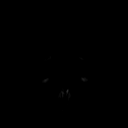

[Series 6: DWI · coronal · 5.0mm · 1.80mm/px · 2 of 36 slices shown (4 of 4)]
[im 1/36]
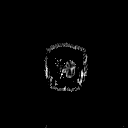
[im 36/36]
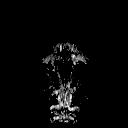

[Series 7: T2 · axial · 5.0mm · 0.51mm/px · z∈[-48,+107]mm · 2 of 24 slices shown (1 of 2)]
[im 1/24]
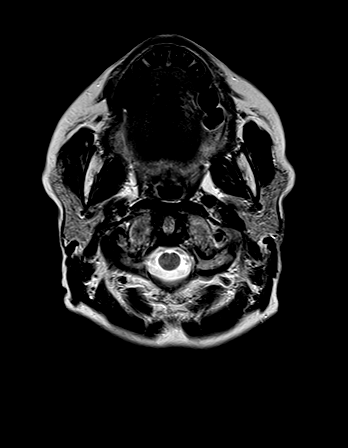
[im 24/24]
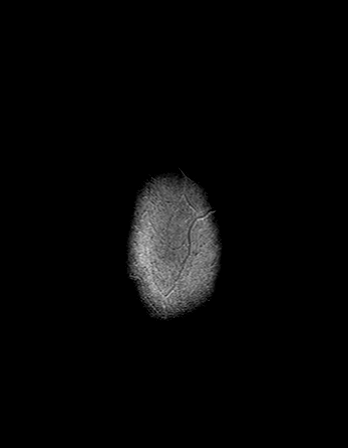

[Series 10: swi_images · axial · 4.0mm · 0.90mm/px · z∈[-41,+98]mm · 2 of 36 slices shown]
[im 1/36]
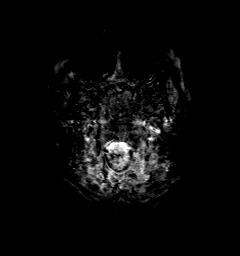
[im 36/36]
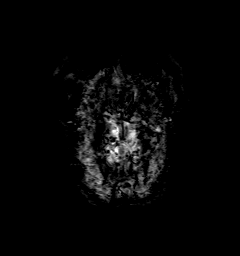

[Series 11: FLAIR · axial · 3.0mm · 0.45mm/px · z∈[-46,+103]mm · 2 of 33 slices shown]
[im 1/33]
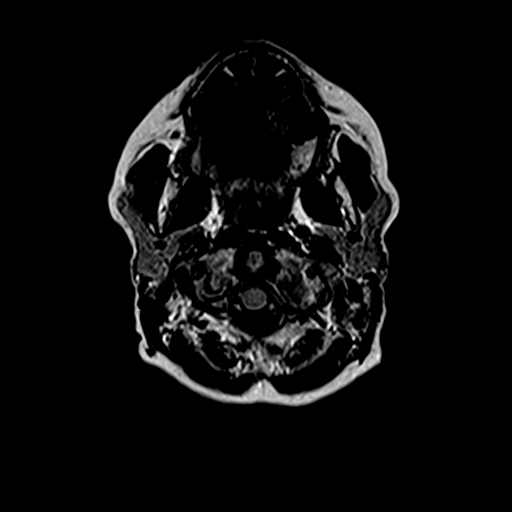
[im 33/33]
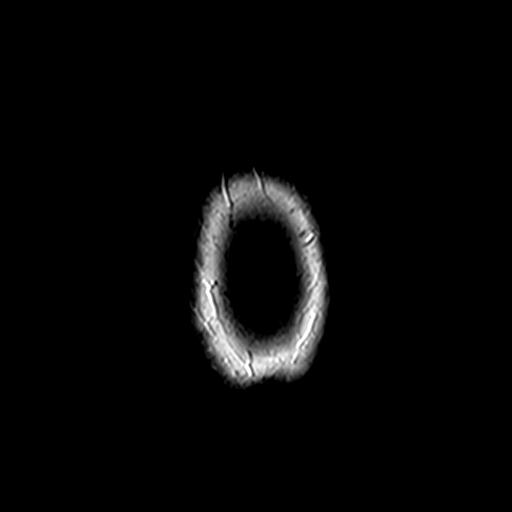

[Series 12: t1_mpr_tra · axial · 1.0mm · 0.75mm/px · z∈[-42,+101]mm · 9 of 144 slices shown (1 of 2)]
[im 1/144]
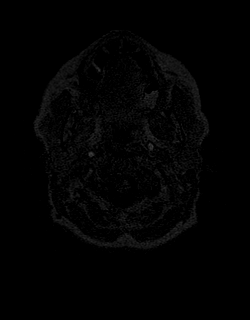
[im 18/144]
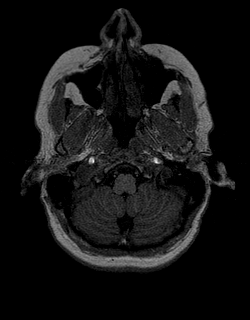
[im 36/144]
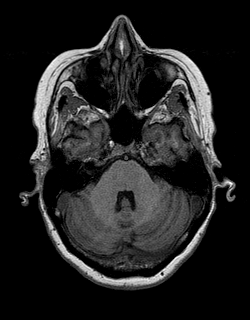
[im 54/144]
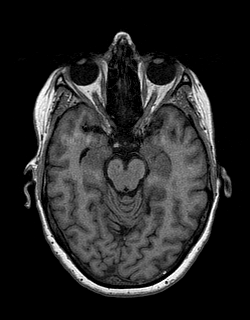
[im 72/144]
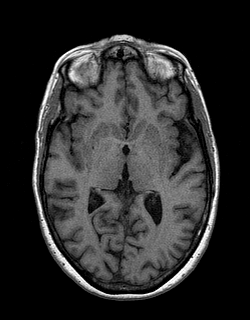
[im 90/144]
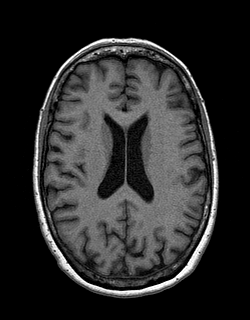
[im 108/144]
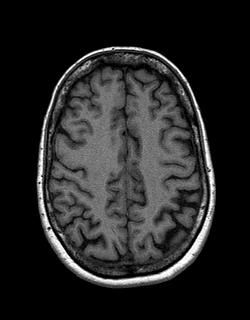
[im 126/144]
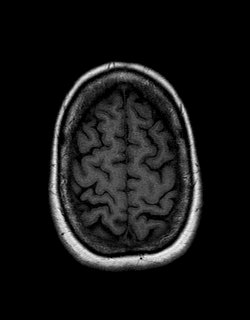
[im 144/144]
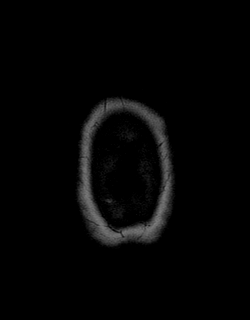

[Series 13: T2 · coronal · 5.0mm · 0.45mm/px · 2 of 28 slices shown (2 of 2)]
[im 1/28]
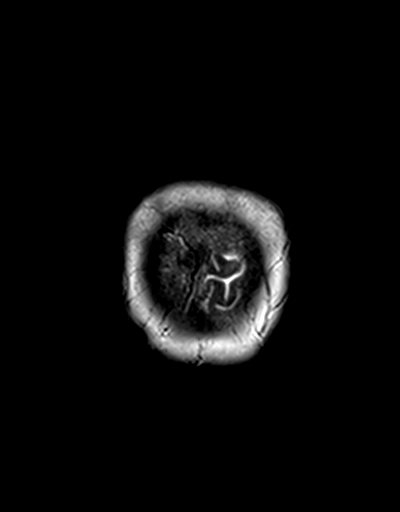
[im 28/28]
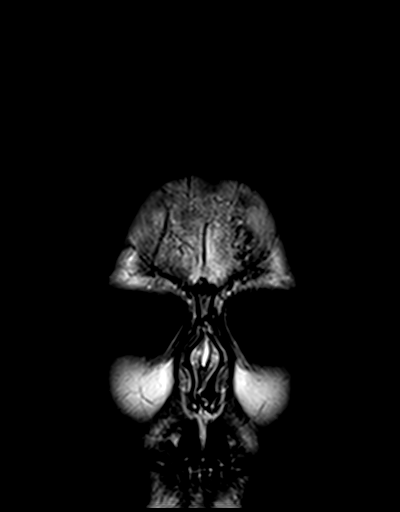

[Series 14: t1_mpr_tra · axial · 1.0mm · 0.75mm/px · z∈[-42,+101]mm · 9 of 144 slices shown (2 of 2)]
[im 1/144]
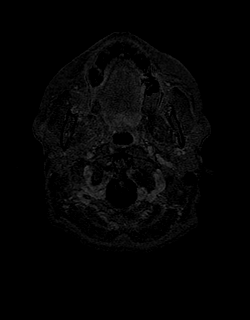
[im 18/144]
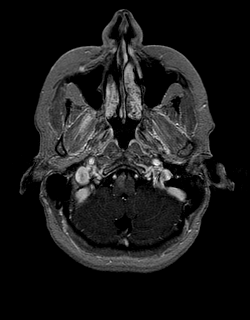
[im 36/144]
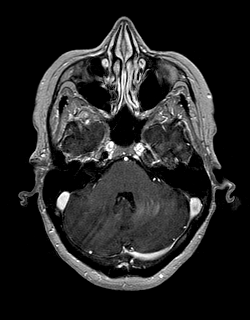
[im 54/144]
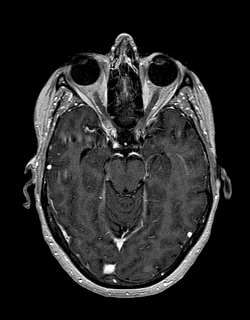
[im 72/144]
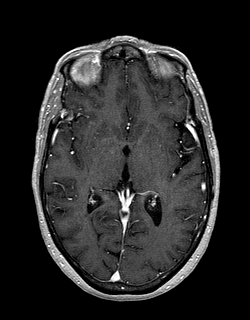
[im 90/144]
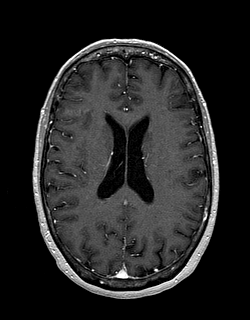
[im 108/144]
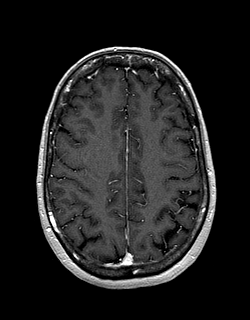
[im 126/144]
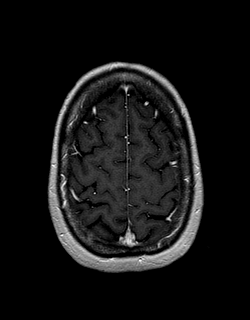
[im 144/144]
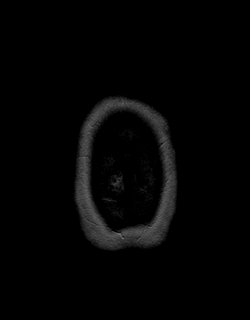

[Series 15: post cor · coronal · 5.0mm · 0.45mm/px · 2 of 28 slices shown]
[im 1/28]
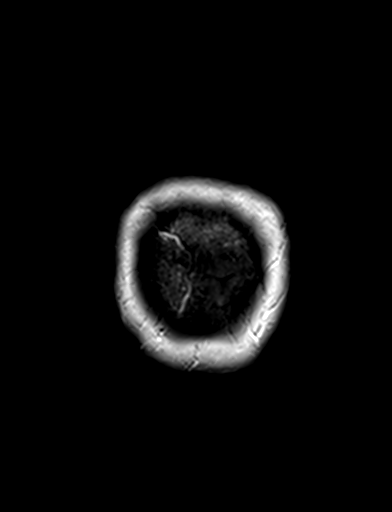
[im 28/28]
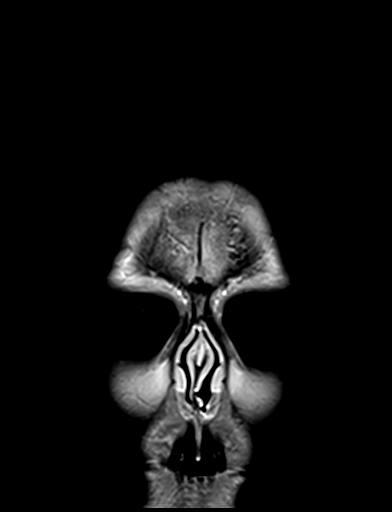

[Series 16: post sag (optional · sagittal · 5.0mm · 0.45mm/px · 1 of 22 slices shown]
[im 1/22]
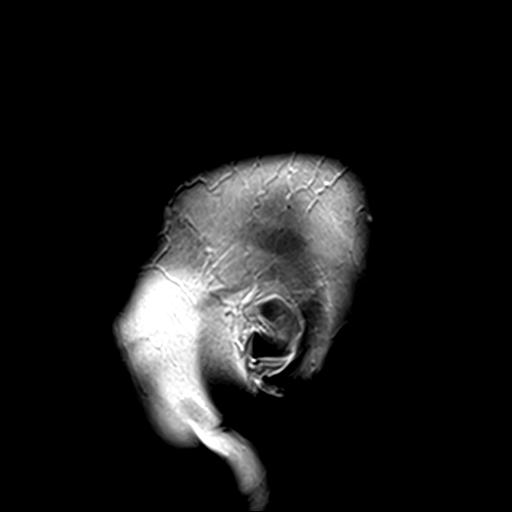

[48 of 48 positions shown; findings below may reference images not displayed]

FINDINGS: Brain: No acute infarction, hemorrhage, hydrocephalus or extra-axial
collection.

No interval change in size or enhancement pattern of the small
dural-based extra-axial lesion in the posterior right frontal region
(series 15, image 17; series 14, image 95), measuring 8 by 7 x 5 mm
(cc, AP, T).

A few foci of T2 hyperintensity are seen within the white matter of
the cerebral hemispheres, nonspecific, most likely related to
chronic small vessel ischemia.

Vascular: Normal flow voids.

Skull and upper cervical spine: Normal marrow signal.

Sinuses/Orbits: Negative.

Other: None.
IMPRESSION: 1. No interval change in size or enhancement pattern of the small
right frontal meningioma.
2. A few foci of T2 hyperintensity within the white matter of the
cerebral hemispheres, nonspecific, most likely related to chronic
small vessel ischemia.

## 2020-01-13 MED ORDER — GADOBENATE DIMEGLUMINE 529 MG/ML IV SOLN
16.0000 mL | Freq: Once | INTRAVENOUS | Status: AC | PRN
  Administered 2020-01-13: 16 mL via INTRAVENOUS

## 2020-01-14 ENCOUNTER — Ambulatory Visit (INDEPENDENT_AMBULATORY_CARE_PROVIDER_SITE_OTHER): Payer: Medicare Other | Admitting: Podiatry

## 2020-01-14 ENCOUNTER — Encounter: Payer: Self-pay | Admitting: Podiatry

## 2020-01-14 DIAGNOSIS — M7662 Achilles tendinitis, left leg: Secondary | ICD-10-CM

## 2020-01-14 DIAGNOSIS — B070 Plantar wart: Secondary | ICD-10-CM | POA: Diagnosis not present

## 2020-01-14 DIAGNOSIS — Q828 Other specified congenital malformations of skin: Secondary | ICD-10-CM

## 2020-01-15 NOTE — Progress Notes (Signed)
Subjective:   Patient ID: Shelby Pace, female   DOB: 69 y.o.   MRN: [id number]   HPI Patient presents stating she thinks she has a wart on the bottom of the right big toe as she had one on her left foot and she continues to experience Achilles tendinitis but utilizes stretching shoe gear modification and has moderate symptoms currently   ROS      Objective:  Physical Exam  Neurovascular status intact with patient found to have new problem consisting of keratotic lesion plantar aspect right hallux which measures approximately 5 x 5 mm and is painful to lateral pressure with pinpoint bleeding.  Patient has continued Achilles tendinitis left which is moderate with enlargement of the bone structure around the posterior heel     Assessment:  Verruca plantaris plantar aspect right with possible porokeratotic lesion along with chronic Achilles tendinitis left     Plan:  8 NP reviewed both conditions extensively.  At this point for the right I did do debridement of the area I then prepped it and applied chemical agent to create immune response to try to kill verruca tissue along with sterile dressing.  Gave instructions on what to do if any blistering were to occur.  For the left we discussed continued Achilles tendinitis and consideration for shockwave therapy and physical therapy and at this point patient is too busy to consider these but at 1 point in future she will address it when she is able.  We did review the continuation of topical medication oral medication as needed and shoe gear modifications

## 2020-03-31 DIAGNOSIS — H524 Presbyopia: Secondary | ICD-10-CM | POA: Diagnosis not present

## 2020-03-31 DIAGNOSIS — H52223 Regular astigmatism, bilateral: Secondary | ICD-10-CM | POA: Diagnosis not present

## 2020-03-31 DIAGNOSIS — H5213 Myopia, bilateral: Secondary | ICD-10-CM | POA: Diagnosis not present

## 2020-03-31 DIAGNOSIS — H2513 Age-related nuclear cataract, bilateral: Secondary | ICD-10-CM | POA: Diagnosis not present

## 2020-04-08 DIAGNOSIS — Z7189 Other specified counseling: Secondary | ICD-10-CM | POA: Diagnosis not present

## 2020-04-08 DIAGNOSIS — E042 Nontoxic multinodular goiter: Secondary | ICD-10-CM | POA: Diagnosis not present

## 2020-04-15 DIAGNOSIS — Z23 Encounter for immunization: Secondary | ICD-10-CM | POA: Diagnosis not present

## 2020-07-09 DIAGNOSIS — M4726 Other spondylosis with radiculopathy, lumbar region: Secondary | ICD-10-CM | POA: Diagnosis not present

## 2020-07-09 DIAGNOSIS — M47812 Spondylosis without myelopathy or radiculopathy, cervical region: Secondary | ICD-10-CM | POA: Diagnosis not present

## 2020-07-09 DIAGNOSIS — E042 Nontoxic multinodular goiter: Secondary | ICD-10-CM | POA: Diagnosis not present

## 2020-07-09 DIAGNOSIS — M810 Age-related osteoporosis without current pathological fracture: Secondary | ICD-10-CM | POA: Diagnosis not present

## 2020-07-09 DIAGNOSIS — E559 Vitamin D deficiency, unspecified: Secondary | ICD-10-CM | POA: Diagnosis not present

## 2020-09-02 DIAGNOSIS — Z1231 Encounter for screening mammogram for malignant neoplasm of breast: Secondary | ICD-10-CM | POA: Diagnosis not present

## 2020-09-08 DIAGNOSIS — Z0000 Encounter for general adult medical examination without abnormal findings: Secondary | ICD-10-CM | POA: Diagnosis not present

## 2020-09-08 DIAGNOSIS — Z1389 Encounter for screening for other disorder: Secondary | ICD-10-CM | POA: Diagnosis not present

## 2020-10-18 DIAGNOSIS — R0981 Nasal congestion: Secondary | ICD-10-CM | POA: Diagnosis not present

## 2021-01-13 DIAGNOSIS — Z780 Asymptomatic menopausal state: Secondary | ICD-10-CM | POA: Diagnosis not present

## 2021-01-13 DIAGNOSIS — M810 Age-related osteoporosis without current pathological fracture: Secondary | ICD-10-CM | POA: Diagnosis not present

## 2021-01-13 DIAGNOSIS — M8589 Other specified disorders of bone density and structure, multiple sites: Secondary | ICD-10-CM | POA: Diagnosis not present

## 2021-01-25 DIAGNOSIS — M810 Age-related osteoporosis without current pathological fracture: Secondary | ICD-10-CM | POA: Diagnosis not present

## 2021-01-25 DIAGNOSIS — Z0000 Encounter for general adult medical examination without abnormal findings: Secondary | ICD-10-CM | POA: Diagnosis not present

## 2021-01-25 DIAGNOSIS — M47812 Spondylosis without myelopathy or radiculopathy, cervical region: Secondary | ICD-10-CM | POA: Diagnosis not present

## 2021-01-25 DIAGNOSIS — D329 Benign neoplasm of meninges, unspecified: Secondary | ICD-10-CM | POA: Diagnosis not present

## 2021-01-25 DIAGNOSIS — Z79899 Other long term (current) drug therapy: Secondary | ICD-10-CM | POA: Diagnosis not present

## 2021-01-25 DIAGNOSIS — Z136 Encounter for screening for cardiovascular disorders: Secondary | ICD-10-CM | POA: Diagnosis not present

## 2021-01-25 DIAGNOSIS — Z6831 Body mass index (BMI) 31.0-31.9, adult: Secondary | ICD-10-CM | POA: Diagnosis not present

## 2021-01-25 DIAGNOSIS — Z23 Encounter for immunization: Secondary | ICD-10-CM | POA: Diagnosis not present

## 2021-01-26 ENCOUNTER — Other Ambulatory Visit: Payer: Self-pay | Admitting: Family Medicine

## 2021-01-26 DIAGNOSIS — D329 Benign neoplasm of meninges, unspecified: Secondary | ICD-10-CM

## 2021-04-21 DIAGNOSIS — Z6830 Body mass index (BMI) 30.0-30.9, adult: Secondary | ICD-10-CM | POA: Diagnosis not present

## 2021-04-21 DIAGNOSIS — J0191 Acute recurrent sinusitis, unspecified: Secondary | ICD-10-CM | POA: Diagnosis not present

## 2021-04-28 DIAGNOSIS — E042 Nontoxic multinodular goiter: Secondary | ICD-10-CM | POA: Diagnosis not present

## 2021-05-18 DIAGNOSIS — H52223 Regular astigmatism, bilateral: Secondary | ICD-10-CM | POA: Diagnosis not present

## 2021-05-18 DIAGNOSIS — H524 Presbyopia: Secondary | ICD-10-CM | POA: Diagnosis not present

## 2021-05-18 DIAGNOSIS — H5213 Myopia, bilateral: Secondary | ICD-10-CM | POA: Diagnosis not present

## 2021-05-18 DIAGNOSIS — H2513 Age-related nuclear cataract, bilateral: Secondary | ICD-10-CM | POA: Diagnosis not present

## 2021-07-07 ENCOUNTER — Other Ambulatory Visit: Payer: Self-pay

## 2021-07-07 ENCOUNTER — Ambulatory Visit (INDEPENDENT_AMBULATORY_CARE_PROVIDER_SITE_OTHER): Payer: Medicare Other

## 2021-07-07 ENCOUNTER — Encounter: Payer: Self-pay | Admitting: Podiatry

## 2021-07-07 ENCOUNTER — Ambulatory Visit (INDEPENDENT_AMBULATORY_CARE_PROVIDER_SITE_OTHER): Payer: Medicare Other | Admitting: Podiatry

## 2021-07-07 DIAGNOSIS — M76821 Posterior tibial tendinitis, right leg: Secondary | ICD-10-CM

## 2021-07-07 DIAGNOSIS — M21611 Bunion of right foot: Secondary | ICD-10-CM

## 2021-07-07 DIAGNOSIS — M21619 Bunion of unspecified foot: Secondary | ICD-10-CM

## 2021-07-07 MED ORDER — TRIAMCINOLONE ACETONIDE 10 MG/ML IJ SUSP
10.0000 mg | Freq: Once | INTRAMUSCULAR | Status: AC
  Administered 2021-07-07: 10 mg

## 2021-07-08 NOTE — Progress Notes (Signed)
Subjective:   Patient ID: Shelby Pace, female   DOB: 70 y.o.   MRN: [id number]   HPI Patient presents stating that she has a lot of pain in her right ankle and she has been very active recently with work and has not been wearing good supportive shoes   ROS      Objective:  Physical Exam  Neurovascular status intact with inflammation around the medial side of the right ankle as the posterior tibial tendon comes underneath the medial malleolus with no indication of muscle dysfunction or loss of strength     Assessment:  Possibility for acute posterior tibial tendinitis right secondary to moderate flatfoot deformity with patient also noted a bunion deformity     Plan:  H&P x-rays reviewed I went ahead did sterile prep and I injected the sheath of the tendon after explaining chances for rupture with 3 mg Dexasone Kenalog 5 mg Xylocaine and I applied a fascial brace to lift up the arch and gave instructions for supportive shoes.  If symptoms persist may have to consider immobilization and she can be reevaluated depending on response  X-rays indicate moderate collapse medial longitudinal arch right moderate bunion deformity noted with previous correction with mild varus deformity with no indication of joint pathology

## 2021-07-14 DIAGNOSIS — M19071 Primary osteoarthritis, right ankle and foot: Secondary | ICD-10-CM | POA: Diagnosis not present

## 2021-07-14 DIAGNOSIS — M79671 Pain in right foot: Secondary | ICD-10-CM | POA: Diagnosis not present

## 2021-10-31 DIAGNOSIS — B351 Tinea unguium: Secondary | ICD-10-CM | POA: Diagnosis not present

## 2021-10-31 DIAGNOSIS — R609 Edema, unspecified: Secondary | ICD-10-CM | POA: Diagnosis not present

## 2021-10-31 DIAGNOSIS — B353 Tinea pedis: Secondary | ICD-10-CM | POA: Diagnosis not present

## 2021-10-31 DIAGNOSIS — Z23 Encounter for immunization: Secondary | ICD-10-CM | POA: Diagnosis not present

## 2021-11-03 DIAGNOSIS — M4722 Other spondylosis with radiculopathy, cervical region: Secondary | ICD-10-CM | POA: Diagnosis not present

## 2021-11-09 ENCOUNTER — Ambulatory Visit (INDEPENDENT_AMBULATORY_CARE_PROVIDER_SITE_OTHER): Payer: Medicare Other | Admitting: Podiatry

## 2021-11-09 ENCOUNTER — Ambulatory Visit (INDEPENDENT_AMBULATORY_CARE_PROVIDER_SITE_OTHER): Payer: Medicare Other

## 2021-11-09 DIAGNOSIS — M7751 Other enthesopathy of right foot: Secondary | ICD-10-CM

## 2021-11-09 DIAGNOSIS — M779 Enthesopathy, unspecified: Secondary | ICD-10-CM

## 2021-11-09 DIAGNOSIS — M2041 Other hammer toe(s) (acquired), right foot: Secondary | ICD-10-CM

## 2021-11-09 MED ORDER — TRIAMCINOLONE ACETONIDE 10 MG/ML IJ SUSP: 10.0000 mg | Freq: Once | INTRAMUSCULAR | Status: AC

## 2021-11-09 NOTE — Progress Notes (Signed)
Subjective:  ? ?Patient ID: Shelby Pace, female   DOB: 71 y.o.   MRN: [id number]  ? ?HPI ?Patient presents with a lot of pain between her fourth and fifth digits on her right foot and states it hurts between the toes and also under the foot.  States been very tender neuro ? ? ?ROS ? ? ?   ?Objective:  ?Physical Exam  ?Vascular status intact with inflammation of the medial side of the fifth digit right with keratotic lesion fluid buildup against the fourth toe mild discomfort plantarly but severe pain between the toes ? ?   ?Assessment:  ?1 possibility that this is an interspace lesion which is creating this creating compression between the toes and keratotic tissue formation ? ?   ?Plan:  ?Patient education rendered and I went ahead today and I anesthetized the fourth and fifth digits right then injected the inner phalangeal joint and into the MPJ 3 mg Dexasone Kenalog debrided lesion and then spent a great deal of time discussing with her arthroplasty with partial syndactylization which may be necessary in future.  Decision to be made based on response to conservative treatment ? ?X-rays indicate there is some retraction of the fifth digit right pressing against the metatarsal head screw pin in place ?   ? ? ?

## 2021-12-21 DIAGNOSIS — M4722 Other spondylosis with radiculopathy, cervical region: Secondary | ICD-10-CM | POA: Diagnosis not present

## 2021-12-21 DIAGNOSIS — D329 Benign neoplasm of meninges, unspecified: Secondary | ICD-10-CM | POA: Diagnosis not present

## 2021-12-21 DIAGNOSIS — Z6831 Body mass index (BMI) 31.0-31.9, adult: Secondary | ICD-10-CM | POA: Diagnosis not present

## 2022-03-21 DIAGNOSIS — Z0489 Encounter for examination and observation for other specified reasons: Secondary | ICD-10-CM | POA: Diagnosis not present

## 2022-04-11 DIAGNOSIS — L03031 Cellulitis of right toe: Secondary | ICD-10-CM | POA: Diagnosis not present

## 2022-04-11 DIAGNOSIS — M21961 Unspecified acquired deformity of right lower leg: Secondary | ICD-10-CM | POA: Diagnosis not present

## 2022-04-11 DIAGNOSIS — M2031 Hallux varus (acquired), right foot: Secondary | ICD-10-CM | POA: Diagnosis not present

## 2022-04-11 DIAGNOSIS — M205X1 Other deformities of toe(s) (acquired), right foot: Secondary | ICD-10-CM | POA: Diagnosis not present

## 2022-04-11 DIAGNOSIS — M2012 Hallux valgus (acquired), left foot: Secondary | ICD-10-CM | POA: Diagnosis not present

## 2022-04-11 DIAGNOSIS — M21622 Bunionette of left foot: Secondary | ICD-10-CM | POA: Diagnosis not present

## 2022-04-11 DIAGNOSIS — M792 Neuralgia and neuritis, unspecified: Secondary | ICD-10-CM | POA: Diagnosis not present

## 2022-04-24 DIAGNOSIS — M2012 Hallux valgus (acquired), left foot: Secondary | ICD-10-CM | POA: Diagnosis not present

## 2022-04-24 DIAGNOSIS — M21961 Unspecified acquired deformity of right lower leg: Secondary | ICD-10-CM | POA: Diagnosis not present

## 2022-04-24 DIAGNOSIS — M792 Neuralgia and neuritis, unspecified: Secondary | ICD-10-CM | POA: Diagnosis not present

## 2022-04-24 DIAGNOSIS — M205X1 Other deformities of toe(s) (acquired), right foot: Secondary | ICD-10-CM | POA: Diagnosis not present

## 2022-04-24 DIAGNOSIS — M2031 Hallux varus (acquired), right foot: Secondary | ICD-10-CM | POA: Diagnosis not present

## 2022-04-24 DIAGNOSIS — M21622 Bunionette of left foot: Secondary | ICD-10-CM | POA: Diagnosis not present

## 2022-04-24 DIAGNOSIS — L03031 Cellulitis of right toe: Secondary | ICD-10-CM | POA: Diagnosis not present

## 2022-05-04 ENCOUNTER — Ambulatory Visit (INDEPENDENT_AMBULATORY_CARE_PROVIDER_SITE_OTHER): Payer: Medicare Other | Admitting: Podiatry

## 2022-05-04 ENCOUNTER — Encounter: Payer: Self-pay | Admitting: Podiatry

## 2022-05-04 DIAGNOSIS — M2041 Other hammer toe(s) (acquired), right foot: Secondary | ICD-10-CM | POA: Diagnosis not present

## 2022-05-04 NOTE — Progress Notes (Signed)
Subjective:   Patient ID: Shelby Pace, female   DOB: 71 y.o.   MRN: [id number]   HPI Patient states her right toe is still killing her and admits that she should have been earlier but she was busy with work and it is becoming unbearable.  States that it is around the fifth toe and interspace and then spreads proximal neuro   ROS      Objective:  Physical Exam  Vascular status intact good digital perfusion noted irritation of the right fifth digit against the fourth digit with chronic interspace lesion is very tender when pressed     Assessment:  Probability that all pain is coming from the inflammation of the interspace and the abnormal bone against bone structure     Plan:  H&P reviewed condition discussed at great length.  I do think surgical intervention is warranted due to the intensity of the discomfort and she wants to have this done.  I reviewed with her consent form I discussed with her all possible complications and recovery.  And patient wants surgery.  After extensive review signed consent form understanding is no guarantee and that all complications can occur and patient is scheduled for outpatient surgery with all questions answered today

## 2022-05-22 MED ORDER — HYDROCODONE-ACETAMINOPHEN 10-325 MG PO TABS: 1.0000 | ORAL_TABLET | Freq: Three times a day (TID) | ORAL | 0 refills | Status: AC | PRN

## 2022-05-22 NOTE — Addendum Note (Signed)
Addended by: Wallene Huh on: 05/22/2022 05:20 PM   Modules accepted: Orders

## 2022-05-23 ENCOUNTER — Telehealth: Payer: Self-pay | Admitting: Podiatry

## 2022-05-23 ENCOUNTER — Encounter: Payer: Self-pay | Admitting: Podiatry

## 2022-05-23 DIAGNOSIS — D492 Neoplasm of unspecified behavior of bone, soft tissue, and skin: Secondary | ICD-10-CM | POA: Diagnosis not present

## 2022-05-23 DIAGNOSIS — D2121 Benign neoplasm of connective and other soft tissue of right lower limb, including hip: Secondary | ICD-10-CM | POA: Diagnosis not present

## 2022-05-23 DIAGNOSIS — M2041 Other hammer toe(s) (acquired), right foot: Secondary | ICD-10-CM | POA: Diagnosis not present

## 2022-05-23 NOTE — Telephone Encounter (Signed)
Pt states the pharmacy does not have medication strength in stock. They have a lower dosage and higher dosage available.   HYDROcodone-acetaminophen (NORCO) 10-325 MG tablet  [id number] [place], [place] - [address]   Please advise

## 2022-05-24 ENCOUNTER — Telehealth: Payer: Self-pay | Admitting: *Deleted

## 2022-05-24 NOTE — Telephone Encounter (Signed)
Patient is calling to ask if it is normal to not have any pain, is icing, elevating ,she feels that she will not have to use the pain medicine at all but has it if needed.

## 2022-05-24 NOTE — Telephone Encounter (Signed)
You can call her. Should be fine with ibuprophen and tylenol

## 2022-05-25 NOTE — Telephone Encounter (Signed)
That is what I expected

## 2022-05-25 NOTE — Telephone Encounter (Signed)
Patient is aware and is not in much pain anymore

## 2022-05-25 NOTE — Telephone Encounter (Signed)
Patient notified and is doing fine and still not having any pain but following post op instructions. She will contact if anything changes.

## 2022-05-29 ENCOUNTER — Ambulatory Visit (INDEPENDENT_AMBULATORY_CARE_PROVIDER_SITE_OTHER): Payer: Medicare Other | Admitting: Podiatry

## 2022-05-29 ENCOUNTER — Encounter: Payer: Self-pay | Admitting: Podiatry

## 2022-05-29 ENCOUNTER — Ambulatory Visit (INDEPENDENT_AMBULATORY_CARE_PROVIDER_SITE_OTHER): Payer: Medicare Other

## 2022-05-29 DIAGNOSIS — Z9889 Other specified postprocedural states: Secondary | ICD-10-CM

## 2022-05-29 DIAGNOSIS — M779 Enthesopathy, unspecified: Secondary | ICD-10-CM | POA: Diagnosis not present

## 2022-05-31 NOTE — Progress Notes (Signed)
Subjective:   Patient ID: Shelby Pace, female   DOB: 71 y.o.   MRN: [id number]   HPI Patient states she is not having any current pain and is very pleased with the surgery that was done last week   ROS      Objective:  Physical Exam  Neurovascular status intact negative Bevelyn Buckles' sign noted fourth interspace right healing well stitches intact wound edges well coapted     Assessment:  Doing well post partial syndactylization fourth interspace right     Plan:  H&P reviewed x-ray.  At this point sterile dressing applied and I advised this patient on 2 weeks for stitch removal.  That will be the only time she should need to be seen and it is a running stitch will need to be removed at that time and then a Band-Aid placed around the area.  At that point assuming everything looks good patient can be discharged  X-rays indicate satisfactory resection head of proximal phalanx digit 5 right

## 2022-06-12 ENCOUNTER — Ambulatory Visit (INDEPENDENT_AMBULATORY_CARE_PROVIDER_SITE_OTHER): Payer: Medicare Other

## 2022-06-12 DIAGNOSIS — Z9889 Other specified postprocedural states: Secondary | ICD-10-CM

## 2022-06-12 NOTE — Progress Notes (Signed)
Patient in office for POV #2 DOS 05/23/2022 HAMMERTOE REPAIR 5TH RT, WEBBING PROCEDURE 4TH/STITCHES REMOVAL.  Patient denies nausea, vomiting, fever and chills at this time. Sutures were removed without complication. Patient tolerated suture removal well. Right hallux toenail was trimmed per patients request. No follow-up appointments needed at this time. Advised patient to call the office if any issues arise. Patient verbalized understanding.

## 2022-07-17 DIAGNOSIS — H2513 Age-related nuclear cataract, bilateral: Secondary | ICD-10-CM | POA: Diagnosis not present

## 2022-07-17 DIAGNOSIS — H53143 Visual discomfort, bilateral: Secondary | ICD-10-CM | POA: Diagnosis not present

## 2022-07-17 DIAGNOSIS — H524 Presbyopia: Secondary | ICD-10-CM | POA: Diagnosis not present

## 2022-07-17 DIAGNOSIS — H5213 Myopia, bilateral: Secondary | ICD-10-CM | POA: Diagnosis not present

## 2022-07-17 DIAGNOSIS — H52223 Regular astigmatism, bilateral: Secondary | ICD-10-CM | POA: Diagnosis not present

## 2022-10-17 DIAGNOSIS — M25511 Pain in right shoulder: Secondary | ICD-10-CM | POA: Diagnosis not present

## 2022-10-17 DIAGNOSIS — M25512 Pain in left shoulder: Secondary | ICD-10-CM | POA: Diagnosis not present

## 2022-10-17 DIAGNOSIS — Z6830 Body mass index (BMI) 30.0-30.9, adult: Secondary | ICD-10-CM | POA: Diagnosis not present

## 2022-10-27 DIAGNOSIS — M25511 Pain in right shoulder: Secondary | ICD-10-CM | POA: Diagnosis not present

## 2022-10-27 DIAGNOSIS — M25512 Pain in left shoulder: Secondary | ICD-10-CM | POA: Diagnosis not present

## 2022-11-01 ENCOUNTER — Ambulatory Visit (INDEPENDENT_AMBULATORY_CARE_PROVIDER_SITE_OTHER): Payer: Medicare Other | Admitting: Podiatry

## 2022-11-01 DIAGNOSIS — M7661 Achilles tendinitis, right leg: Secondary | ICD-10-CM | POA: Diagnosis not present

## 2022-11-01 NOTE — Progress Notes (Signed)
  Subjective:  Patient ID: Shelby Pace, female    DOB: 05-19-1951,  MRN: [id number]  Chief Complaint  Patient presents with   Foot Pain    Right foot pain     72 y.o. female presents with the above complaint.  Patient presents with new complaint of right Achilles tendinitis pain.  She states it only happens when she is standing on her feet.  She does not feel pain when she is resting.  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  She wanted to get it evaluated she has a boot at home she denies any other acute complaints.     Review of Systems: Negative except as noted in the HPI. Denies N/V/F/Ch.  No past medical history on file.  Current Outpatient Medications:    acetaminophen (TYLENOL) 325 MG tablet, 1 tablet as needed, Disp: , Rfl:    cholecalciferol (VITAMIN D) 1000 UNITS tablet, Take 5,000 Units by mouth daily. , Disp: , Rfl:    cyclobenzaprine (FLEXERIL) 10 MG tablet, Take 10 mg by mouth daily after supper., Disp: , Rfl:    meloxicam (MOBIC) 15 MG tablet, meloxicam 15 mg tablet  Take 1 tablet every day by oral route., Disp: , Rfl:    Multiple Vitamins-Minerals (PRESERVISION AREDS PO), Take by mouth., Disp: , Rfl:    omeprazole (PRILOSEC) 20 MG capsule, Take 20 mg by mouth every morning., Disp: , Rfl:   Current Facility-Administered Medications:    triamcinolone acetonide (KENALOG) 10 MG/ML injection 10 mg, 10 mg, Other, Once, Regal, Tamala Fothergill, DPM  Social History   Tobacco Use  Smoking Status Never  Smokeless Tobacco Never    Allergies  Allergen Reactions   Penicillamine    Penicillins Swelling and Rash   Objective:  There were no vitals filed for this visit. There is no height or weight on file to calculate BMI. Constitutional Well developed. Well nourished.  Vascular Dorsalis pedis pulses palpable bilaterally. Posterior tibial pulses palpable bilaterally. Capillary refill normal to all digits.  No cyanosis or clubbing noted. Pedal hair growth normal.  Neurologic  Normal speech. Oriented to person, place, and time. Epicritic sensation to light touch grossly present bilaterally.  Dermatologic Nails well groomed and normal in appearance. No open wounds. No skin lesions.  Orthopedic: Right Achilles tendinitis insertional pain okay.  Positive Silfverskiold test with gastrocnemius equinus.  Haglund's deformity clinically appreciated.  No pain at the posterior tibial tendon peroneal tendon ATFL ligament.  Pain with dorsiflexion of the ankle joint no pain with plantarflexion of the ankle joint   Radiographs: None Assessment:   1. Right Achilles tendinitis    Plan:  Patient was evaluated and treated and all questions answered.  Right Achilles tendinitis -I explained the patient the etiology of tendinitis worse treatment options were discussed.  Given the amount of pain that she is having she will benefit from cam boot immobilization she already has 1 at home.  She states that she will replace it on her foot for 4 weeks.  I will see her back again in 4 weeks we will reevaluate if there is still some residual pain we will discuss steroid injection versus MRI.   No follow-ups on file.

## 2022-11-06 ENCOUNTER — Ambulatory Visit (INDEPENDENT_AMBULATORY_CARE_PROVIDER_SITE_OTHER): Payer: Medicare Other

## 2022-11-06 ENCOUNTER — Ambulatory Visit (INDEPENDENT_AMBULATORY_CARE_PROVIDER_SITE_OTHER): Payer: Medicare Other | Admitting: Podiatry

## 2022-11-06 ENCOUNTER — Encounter: Payer: Self-pay | Admitting: Podiatry

## 2022-11-06 DIAGNOSIS — M7661 Achilles tendinitis, right leg: Secondary | ICD-10-CM | POA: Diagnosis not present

## 2022-11-08 NOTE — Progress Notes (Signed)
Subjective:   Patient ID: Shelby Pace, female   DOB: 71 y.o.   MRN: [id number]   HPI Patient developed severe pain in her right Achilles and heel last week and has been in a boot stating it some better but she just wanted it checked and she is going to [place] in May   ROS      Objective:  Physical Exam  Neurovascular status intact with posterior pain right that is mild but had been severe last week.     Assessment:  Probability for acute Achilles tendinitis right seems to have responded to immobilization     Plan:  Reviewed condition since it is improving so much I recommended the continuation of immobilization but do not recommend anything more aggressive.  Patient will be seen back as needed all questions answered today

## 2022-11-14 DIAGNOSIS — R293 Abnormal posture: Secondary | ICD-10-CM | POA: Diagnosis not present

## 2022-11-14 DIAGNOSIS — M7541 Impingement syndrome of right shoulder: Secondary | ICD-10-CM | POA: Diagnosis not present

## 2022-11-14 DIAGNOSIS — M6281 Muscle weakness (generalized): Secondary | ICD-10-CM | POA: Diagnosis not present

## 2022-11-14 DIAGNOSIS — M25512 Pain in left shoulder: Secondary | ICD-10-CM | POA: Diagnosis not present

## 2022-11-14 DIAGNOSIS — M25511 Pain in right shoulder: Secondary | ICD-10-CM | POA: Diagnosis not present

## 2022-11-21 DIAGNOSIS — M25512 Pain in left shoulder: Secondary | ICD-10-CM | POA: Diagnosis not present

## 2022-11-21 DIAGNOSIS — M25511 Pain in right shoulder: Secondary | ICD-10-CM | POA: Diagnosis not present

## 2022-11-21 DIAGNOSIS — M7541 Impingement syndrome of right shoulder: Secondary | ICD-10-CM | POA: Diagnosis not present

## 2022-11-21 DIAGNOSIS — M6281 Muscle weakness (generalized): Secondary | ICD-10-CM | POA: Diagnosis not present

## 2022-11-21 DIAGNOSIS — R293 Abnormal posture: Secondary | ICD-10-CM | POA: Diagnosis not present

## 2022-11-22 DIAGNOSIS — M25512 Pain in left shoulder: Secondary | ICD-10-CM | POA: Diagnosis not present

## 2022-11-22 DIAGNOSIS — M6281 Muscle weakness (generalized): Secondary | ICD-10-CM | POA: Diagnosis not present

## 2022-11-22 DIAGNOSIS — R293 Abnormal posture: Secondary | ICD-10-CM | POA: Diagnosis not present

## 2022-11-22 DIAGNOSIS — M7541 Impingement syndrome of right shoulder: Secondary | ICD-10-CM | POA: Diagnosis not present

## 2022-11-22 DIAGNOSIS — M25511 Pain in right shoulder: Secondary | ICD-10-CM | POA: Diagnosis not present

## 2022-11-27 ENCOUNTER — Ambulatory Visit (INDEPENDENT_AMBULATORY_CARE_PROVIDER_SITE_OTHER): Payer: Medicare Other | Admitting: Podiatry

## 2022-11-27 ENCOUNTER — Encounter: Payer: Self-pay | Admitting: Podiatry

## 2022-11-27 DIAGNOSIS — L600 Ingrowing nail: Secondary | ICD-10-CM | POA: Diagnosis not present

## 2022-11-27 DIAGNOSIS — M76821 Posterior tibial tendinitis, right leg: Secondary | ICD-10-CM

## 2022-11-27 NOTE — Patient Instructions (Signed)

## 2022-11-28 DIAGNOSIS — M25512 Pain in left shoulder: Secondary | ICD-10-CM | POA: Diagnosis not present

## 2022-11-28 DIAGNOSIS — M6281 Muscle weakness (generalized): Secondary | ICD-10-CM | POA: Diagnosis not present

## 2022-11-28 DIAGNOSIS — R293 Abnormal posture: Secondary | ICD-10-CM | POA: Diagnosis not present

## 2022-11-28 DIAGNOSIS — M7541 Impingement syndrome of right shoulder: Secondary | ICD-10-CM | POA: Diagnosis not present

## 2022-11-28 DIAGNOSIS — M25511 Pain in right shoulder: Secondary | ICD-10-CM | POA: Diagnosis not present

## 2022-11-28 NOTE — Progress Notes (Signed)
Subjective:   Patient ID: Shelby Pace, female   DOB: 72 y.o.   MRN: [id number]   HPI Patient states my foot overall has been doing well but developed a painful ingrown toenail on the right big toe that is making it increasingly hard to wear shoe gear.  States it has been a week it has been really painful and that she is leaving an approximate month for a trip to [place]   ROS      Objective:  Physical Exam  Neurovascular status intact with the patient found to have incurvated right hallux lateral border painful when pressed localized no erythema edema drainage     Assessment:  Ingrown toenail deformity of the right hallux lateral border with pain     Plan:  H&P reviewed recommended correction of deformity explained procedure and patient signed consent form reviewing risk.  Today I infiltrated the right hallux 60 mg like Marcaine mixture sterile prep done and using sterile instrumentation remove the lateral border exposed matrix applied phenol 3 applications 30 seconds followed by alcohol lavage sterile dressing gave instructions on soaks and to wear dressing 24 hours take it off earlier if throbbing were to occur.  Reappoint as symptoms indicate

## 2022-11-30 DIAGNOSIS — M25512 Pain in left shoulder: Secondary | ICD-10-CM | POA: Diagnosis not present

## 2022-11-30 DIAGNOSIS — R293 Abnormal posture: Secondary | ICD-10-CM | POA: Diagnosis not present

## 2022-11-30 DIAGNOSIS — M25511 Pain in right shoulder: Secondary | ICD-10-CM | POA: Diagnosis not present

## 2022-11-30 DIAGNOSIS — M7541 Impingement syndrome of right shoulder: Secondary | ICD-10-CM | POA: Diagnosis not present

## 2022-11-30 DIAGNOSIS — M6281 Muscle weakness (generalized): Secondary | ICD-10-CM | POA: Diagnosis not present

## 2022-12-05 DIAGNOSIS — R293 Abnormal posture: Secondary | ICD-10-CM | POA: Diagnosis not present

## 2022-12-05 DIAGNOSIS — M6281 Muscle weakness (generalized): Secondary | ICD-10-CM | POA: Diagnosis not present

## 2022-12-05 DIAGNOSIS — M25511 Pain in right shoulder: Secondary | ICD-10-CM | POA: Diagnosis not present

## 2022-12-05 DIAGNOSIS — M7541 Impingement syndrome of right shoulder: Secondary | ICD-10-CM | POA: Diagnosis not present

## 2022-12-05 DIAGNOSIS — M25512 Pain in left shoulder: Secondary | ICD-10-CM | POA: Diagnosis not present

## 2022-12-07 ENCOUNTER — Telehealth: Payer: Self-pay | Admitting: Podiatry

## 2022-12-07 DIAGNOSIS — M25512 Pain in left shoulder: Secondary | ICD-10-CM | POA: Diagnosis not present

## 2022-12-07 DIAGNOSIS — M7541 Impingement syndrome of right shoulder: Secondary | ICD-10-CM | POA: Diagnosis not present

## 2022-12-07 DIAGNOSIS — M6281 Muscle weakness (generalized): Secondary | ICD-10-CM | POA: Diagnosis not present

## 2022-12-07 DIAGNOSIS — R293 Abnormal posture: Secondary | ICD-10-CM | POA: Diagnosis not present

## 2022-12-07 DIAGNOSIS — M25511 Pain in right shoulder: Secondary | ICD-10-CM | POA: Diagnosis not present

## 2022-12-07 NOTE — Telephone Encounter (Signed)
That is normal for this period postop

## 2022-12-07 NOTE — Telephone Encounter (Signed)
Notified pt and she said thank you. I did tell her if she sees any blood, painful or warm to the touch to please let us know and we will get her in.

## 2022-12-07 NOTE — Telephone Encounter (Signed)
Toenail removed a 4.22 and  still red around toe and still weeping a little on the bandaid. Pt not sure if clear drainage or pinkish due to bandaid color. Not warm to touch and no pain

## 2022-12-13 DIAGNOSIS — R293 Abnormal posture: Secondary | ICD-10-CM | POA: Diagnosis not present

## 2022-12-13 DIAGNOSIS — M6281 Muscle weakness (generalized): Secondary | ICD-10-CM | POA: Diagnosis not present

## 2022-12-13 DIAGNOSIS — M25511 Pain in right shoulder: Secondary | ICD-10-CM | POA: Diagnosis not present

## 2022-12-13 DIAGNOSIS — M25512 Pain in left shoulder: Secondary | ICD-10-CM | POA: Diagnosis not present

## 2022-12-13 DIAGNOSIS — M7541 Impingement syndrome of right shoulder: Secondary | ICD-10-CM | POA: Diagnosis not present

## 2023-01-09 DIAGNOSIS — M6281 Muscle weakness (generalized): Secondary | ICD-10-CM | POA: Diagnosis not present

## 2023-01-09 DIAGNOSIS — M7541 Impingement syndrome of right shoulder: Secondary | ICD-10-CM | POA: Diagnosis not present

## 2023-01-09 DIAGNOSIS — R293 Abnormal posture: Secondary | ICD-10-CM | POA: Diagnosis not present

## 2023-01-09 DIAGNOSIS — M25512 Pain in left shoulder: Secondary | ICD-10-CM | POA: Diagnosis not present

## 2023-01-09 DIAGNOSIS — M25511 Pain in right shoulder: Secondary | ICD-10-CM | POA: Diagnosis not present

## 2023-01-30 DIAGNOSIS — M19011 Primary osteoarthritis, right shoulder: Secondary | ICD-10-CM | POA: Diagnosis not present

## 2023-01-30 DIAGNOSIS — M67813 Other specified disorders of tendon, right shoulder: Secondary | ICD-10-CM | POA: Diagnosis not present

## 2023-01-30 DIAGNOSIS — M25511 Pain in right shoulder: Secondary | ICD-10-CM | POA: Diagnosis not present

## 2023-02-27 DIAGNOSIS — M19011 Primary osteoarthritis, right shoulder: Secondary | ICD-10-CM | POA: Diagnosis not present

## 2023-04-06 DIAGNOSIS — G43B0 Ophthalmoplegic migraine, not intractable: Secondary | ICD-10-CM | POA: Diagnosis not present

## 2023-05-07 DIAGNOSIS — M810 Age-related osteoporosis without current pathological fracture: Secondary | ICD-10-CM | POA: Diagnosis not present

## 2023-05-07 DIAGNOSIS — Z92241 Personal history of systemic steroid therapy: Secondary | ICD-10-CM | POA: Diagnosis not present

## 2023-05-07 DIAGNOSIS — E042 Nontoxic multinodular goiter: Secondary | ICD-10-CM | POA: Diagnosis not present

## 2023-05-09 ENCOUNTER — Other Ambulatory Visit: Payer: Self-pay | Admitting: Internal Medicine

## 2023-05-09 DIAGNOSIS — M810 Age-related osteoporosis without current pathological fracture: Secondary | ICD-10-CM

## 2023-07-17 DIAGNOSIS — H2513 Age-related nuclear cataract, bilateral: Secondary | ICD-10-CM | POA: Diagnosis not present

## 2023-07-17 DIAGNOSIS — H52223 Regular astigmatism, bilateral: Secondary | ICD-10-CM | POA: Diagnosis not present

## 2023-07-17 DIAGNOSIS — H43393 Other vitreous opacities, bilateral: Secondary | ICD-10-CM | POA: Diagnosis not present

## 2023-07-17 DIAGNOSIS — H5213 Myopia, bilateral: Secondary | ICD-10-CM | POA: Diagnosis not present

## 2023-07-17 DIAGNOSIS — H524 Presbyopia: Secondary | ICD-10-CM | POA: Diagnosis not present

## 2023-07-17 DIAGNOSIS — H53143 Visual discomfort, bilateral: Secondary | ICD-10-CM | POA: Diagnosis not present

## 2023-08-10 DIAGNOSIS — M79672 Pain in left foot: Secondary | ICD-10-CM | POA: Diagnosis not present

## 2023-08-10 DIAGNOSIS — M79671 Pain in right foot: Secondary | ICD-10-CM | POA: Diagnosis not present

## 2023-08-15 ENCOUNTER — Ambulatory Visit (INDEPENDENT_AMBULATORY_CARE_PROVIDER_SITE_OTHER): Payer: Medicare Other

## 2023-08-15 ENCOUNTER — Ambulatory Visit (INDEPENDENT_AMBULATORY_CARE_PROVIDER_SITE_OTHER): Payer: Medicare Other | Admitting: Podiatry

## 2023-08-15 ENCOUNTER — Encounter: Payer: Self-pay | Admitting: Podiatry

## 2023-08-15 VITALS — Ht 64.0 in | Wt 150.0 lb

## 2023-08-15 DIAGNOSIS — M7752 Other enthesopathy of left foot: Secondary | ICD-10-CM | POA: Diagnosis not present

## 2023-08-15 DIAGNOSIS — M778 Other enthesopathies, not elsewhere classified: Secondary | ICD-10-CM

## 2023-08-15 DIAGNOSIS — M7751 Other enthesopathy of right foot: Secondary | ICD-10-CM | POA: Diagnosis not present

## 2023-08-15 DIAGNOSIS — M2042 Other hammer toe(s) (acquired), left foot: Secondary | ICD-10-CM

## 2023-08-15 DIAGNOSIS — M21621 Bunionette of right foot: Secondary | ICD-10-CM

## 2023-08-15 MED ORDER — TRIAMCINOLONE ACETONIDE 10 MG/ML IJ SUSP
10.0000 mg | Freq: Once | INTRAMUSCULAR | Status: AC
  Administered 2023-08-15: 10 mg via INTRA_ARTICULAR

## 2023-08-15 NOTE — Progress Notes (Signed)
 Subjective:   Patient ID: Shelby Pace, female   DOB: 73 y.o.   MRN: [id number]   HPI Patient presents with a lot of pain in the outside of both feet and also concerned about elevation of the second and third toe left recently with family history of this condition   ROS      Objective:  Physical Exam  Neuro vascular status intact inflammation around the fifth MPJ both feet with keratotic tissue formation and on the distal lateral aspect digit 5 right with moderate rigid contracture digits 2 and 3 left foot with redness on top of the toe     Assessment:  Acute condition with inflammatory processes fifth MPJ bilateral with prominence of the bone structure and hammertoe deformity left second and third toes H&P     Plan:  Rays reviewed discussed digital fusion left of the second and third toes explaining what will can be done and at this point dispensed padding to try to take pressure off the toes.  Sterile prep injected the fifth MPJ bilateral 2 mg dexamethasone Kenalog  5 mg I can debrided lesions will be seen back 3 weeks may require further work depending on response  X-rays indicate prominence around the fifth metatarsal head both feet with elevated second and third toes left

## 2023-08-17 DIAGNOSIS — M19071 Primary osteoarthritis, right ankle and foot: Secondary | ICD-10-CM | POA: Diagnosis not present

## 2023-08-27 ENCOUNTER — Ambulatory Visit: Payer: Medicare Other | Admitting: Podiatry

## 2023-09-05 ENCOUNTER — Ambulatory Visit: Payer: Medicare Other | Admitting: Podiatry

## 2023-09-07 ENCOUNTER — Ambulatory Visit: Payer: Medicare Other | Admitting: Podiatry

## 2023-09-25 DIAGNOSIS — M19071 Primary osteoarthritis, right ankle and foot: Secondary | ICD-10-CM | POA: Diagnosis not present

## 2023-09-25 DIAGNOSIS — M79672 Pain in left foot: Secondary | ICD-10-CM | POA: Diagnosis not present

## 2023-10-08 DIAGNOSIS — G609 Hereditary and idiopathic neuropathy, unspecified: Secondary | ICD-10-CM | POA: Diagnosis not present

## 2023-10-15 DIAGNOSIS — G609 Hereditary and idiopathic neuropathy, unspecified: Secondary | ICD-10-CM | POA: Diagnosis not present

## 2023-10-15 DIAGNOSIS — M19071 Primary osteoarthritis, right ankle and foot: Secondary | ICD-10-CM | POA: Diagnosis not present

## 2023-10-31 DIAGNOSIS — M19071 Primary osteoarthritis, right ankle and foot: Secondary | ICD-10-CM | POA: Diagnosis not present

## 2023-10-31 DIAGNOSIS — G609 Hereditary and idiopathic neuropathy, unspecified: Secondary | ICD-10-CM | POA: Diagnosis not present

## 2023-11-13 DIAGNOSIS — M76821 Posterior tibial tendinitis, right leg: Secondary | ICD-10-CM | POA: Diagnosis not present

## 2023-11-13 DIAGNOSIS — M79671 Pain in right foot: Secondary | ICD-10-CM | POA: Diagnosis not present

## 2023-12-05 DIAGNOSIS — Z6830 Body mass index (BMI) 30.0-30.9, adult: Secondary | ICD-10-CM | POA: Diagnosis not present

## 2023-12-05 DIAGNOSIS — M76829 Posterior tibial tendinitis, unspecified leg: Secondary | ICD-10-CM | POA: Diagnosis not present

## 2023-12-05 DIAGNOSIS — G629 Polyneuropathy, unspecified: Secondary | ICD-10-CM | POA: Diagnosis not present

## 2023-12-05 DIAGNOSIS — R195 Other fecal abnormalities: Secondary | ICD-10-CM | POA: Diagnosis not present

## 2023-12-18 DIAGNOSIS — R112 Nausea with vomiting, unspecified: Secondary | ICD-10-CM | POA: Diagnosis not present

## 2023-12-18 DIAGNOSIS — Z03818 Encounter for observation for suspected exposure to other biological agents ruled out: Secondary | ICD-10-CM | POA: Diagnosis not present

## 2023-12-18 DIAGNOSIS — R52 Pain, unspecified: Secondary | ICD-10-CM | POA: Diagnosis not present

## 2023-12-18 DIAGNOSIS — R6883 Chills (without fever): Secondary | ICD-10-CM | POA: Diagnosis not present

## 2024-01-14 ENCOUNTER — Telehealth: Payer: Self-pay | Admitting: Diagnostic Neuroimaging

## 2024-01-14 NOTE — Telephone Encounter (Signed)
 Request to speak with referral dept, pt will see if she can see another MD before current Dr's next available

## 2024-01-15 ENCOUNTER — Ambulatory Visit: Payer: Medicare Other | Admitting: Diagnostic Neuroimaging

## 2024-01-27 NOTE — Progress Notes (Deleted)
 [facility]  PATIENT: Shelby Pace DOB: [birth year]  REFERRING DOCTOR OR PCP:  *** SOURCE: ***  _________________________________   HISTORICAL  CHIEF COMPLAINT:  No chief complaint on file.   HISTORY OF PRESENT ILLNESS:  ***   Imaging: MRI of the brain 01/13/2020 showed that brain volume was normal for age.  There were just a couple punctate T2/FLAIR hyperintense foci in the hemispheres consistent with very minimal chronic microvascular ischemic changes and 7 mm meningioma in the right frontal region  MRI of the cervical spine 09/15/2019 showed multilevel degenerative changes causing moderate left foraminal narrowing at C5-C6 and borderline spinal stenosis at C4-C5, C5-C6 and C6-C7.  There is 2 mm anterolisthesis of C7 on T1.  No nerve root compression.  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  This has been biopsied in the past.  REVIEW OF SYSTEMS: Constitutional: No fevers, chills, sweats, or change in appetite Eyes: No visual changes, double vision, eye pain Ear, nose and throat: No hearing loss, ear pain, nasal congestion, sore throat Cardiovascular: No chest pain, palpitations Respiratory:  No shortness of breath at rest or with exertion.   No wheezes GastrointestinaI: No nausea, vomiting, diarrhea, abdominal pain, fecal incontinence Genitourinary:  No dysuria, urinary retention or frequency.  No nocturia. Musculoskeletal:  No neck pain, back pain Integumentary: No rash, pruritus, skin lesions Neurological: as above Psychiatric: No depression at this time.  No anxiety Endocrine: No palpitations, diaphoresis, change in appetite, change in weigh or increased thirst Hematologic/Lymphatic:  No anemia, purpura, petechiae. Allergic/Immunologic: No itchy/runny eyes, nasal congestion, recent allergic reactions, rashes  ALLERGIES: Allergies  Allergen Reactions   Penicillamine    Penicillins Swelling and Rash    HOME  MEDICATIONS:  Current Outpatient Medications:    acetaminophen  (TYLENOL ) 325 MG tablet, 1 tablet as needed, Disp: , Rfl:    cholecalciferol (VITAMIN D) 1000 UNITS tablet, Take 5,000 Units by mouth daily. , Disp: , Rfl:    cyclobenzaprine (FLEXERIL) 10 MG tablet, Take 10 mg by mouth daily after supper., Disp: , Rfl:    meloxicam (MOBIC) 15 MG tablet, meloxicam 15 mg tablet  Take 1 tablet every day by oral route., Disp: , Rfl:    Multiple Vitamins-Minerals (PRESERVISION AREDS PO), Take by mouth., Disp: , Rfl:    omeprazole (PRILOSEC) 20 MG capsule, Take 20 mg by mouth every morning., Disp: , Rfl:   Current Facility-Administered Medications:    triamcinolone  acetonide (KENALOG ) 10 MG/ML injection 10 mg, 10 mg, Other, Once, Regal, [place] RAMAN, DPM  PAST MEDICAL HISTORY: No past medical history on file.  PAST SURGICAL HISTORY: No past surgical history on file.  FAMILY HISTORY: No family history on file.  SOCIAL HISTORY: Social History   Socioeconomic History   Marital status: Married    Spouse name: Not on file   Number of children: Not on file   Years of education: Not on file   Highest education level: Not on file  Occupational History   Not on file  Tobacco Use   Smoking status: Never   Smokeless tobacco: Never  Substance and Sexual Activity   Alcohol use: No   Drug use: No   Sexual activity: Not on file  Other Topics Concern   Not on file  Social History Narrative   Not on file   Social Drivers of Health   Financial Resource Strain: Low Risk  (11/13/2023)   Received from [facility]   Overall Financial Resource Strain (  CARDIA)    Difficulty of Paying Living Expenses: Not hard at all  Food Insecurity: No Food Insecurity (11/13/2023)   Received from [facility]   Hunger Vital Sign    Within the past 12 months, you worried that your food would run out before you got the money to buy more.: Never true    Within the past 12 months, the  food you bought just didn't last and you didn't have money to get more.: Never true  Transportation Needs: No Transportation Needs (11/13/2023)   Received from [facility] - Transportation    In the past 12 months, has lack of transportation kept you from medical appointments or from getting medications?: No    Lack of Transportation (Non-Medical): No  Physical Activity: Not on file  Stress: Not on file  Social Connections: Not on file  Intimate Partner Violence: Not on file       PHYSICAL EXAM  There were no vitals filed for this visit.  There is no height or weight on file to calculate BMI.   General: The patient is well-developed and well-nourished and in no acute distress  HEENT:  Head is [place]/AT.  Sclera are anicteric.  Funduscopic exam shows normal optic discs and retinal vessels.  Neck: No carotid bruits are noted.  The neck is nontender.  Cardiovascular: The heart has a regular rate and rhythm with a normal S1 and S2. There were no murmurs, gallops or rubs.    Skin: Extremities are without rash or  edema.  Musculoskeletal:  Back is nontender  Neurologic Exam  Mental status: The patient is alert and oriented x 3 at the time of the examination. The patient has apparent normal recent and remote memory, with an apparently normal attention span and concentration ability.   Speech is normal.  Cranial nerves: Extraocular movements are full. Pupils are equal, round, and reactive to light and accomodation.  Visual fields are full.  Facial symmetry is present. There is good facial sensation to soft touch bilaterally.Facial strength is normal.  Trapezius and sternocleidomastoid strength is normal. No dysarthria is noted.  The tongue is midline, and the patient has symmetric elevation of the soft palate. No obvious hearing deficits are noted.  Motor:  Muscle bulk is normal.   Tone is normal. Strength is  5 / 5 in all 4 extremities.   Sensory: Sensory  testing is intact to pinprick, soft touch and vibration sensation in all 4 extremities.  Coordination: Cerebellar testing reveals good finger-nose-finger and heel-to-shin bilaterally.  Gait and station: Station is normal.   Gait is normal. Tandem gait is normal. Romberg is negative.   Reflexes: Deep tendon reflexes are symmetric and normal bilaterally.   Plantar responses are flexor.    DIAGNOSTIC DATA (LABS, IMAGING, TESTING) - I reviewed patient records, labs, notes, testing and imaging myself where available.  No results found for: WBC, HGB, HCT, MCV, PLT No results found for: NA, K, CL, CO2, GLUCOSE, BUN, CREATININE, CALCIUM, PROT, ALBUMIN, AST, ALT, ALKPHOS, BILITOT, GFRNONAA, GFRAA No results found for: CHOL, HDL, LDLCALC, LDLDIRECT, TRIG, CHOLHDL No results found for: YHAJ8R No results found for: VITAMINB12 No results found for: TSH     ASSESSMENT AND PLAN  ***   Patsy Varma A. Vear, MD, [facility] 01/27/2024, 6:58 PM Certified in Neurology, Clinical Neurophysiology, Sleep Medicine and Neuroimaging  [facility] [address], [address] [place], [place] [address] [phone]

## 2024-01-29 ENCOUNTER — Telehealth: Payer: Self-pay | Admitting: Neurology

## 2024-01-29 NOTE — Telephone Encounter (Signed)
 Patient rescheduled appointment due to scheduling conflict

## 2024-01-30 ENCOUNTER — Ambulatory Visit: Admitting: Neurology

## 2024-04-02 ENCOUNTER — Other Ambulatory Visit: Payer: Medicare Other

## 2024-04-02 ENCOUNTER — Ambulatory Visit (HOSPITAL_BASED_OUTPATIENT_CLINIC_OR_DEPARTMENT_OTHER)
Admission: RE | Admit: 2024-04-02 | Discharge: 2024-04-02 | Disposition: A | Discharge status: Home / Self Care | Source: Ambulatory Visit | Attending: Internal Medicine | Admitting: Internal Medicine

## 2024-04-02 DIAGNOSIS — M810 Age-related osteoporosis without current pathological fracture: Secondary | ICD-10-CM | POA: Diagnosis not present

## 2024-04-02 DIAGNOSIS — Z780 Asymptomatic menopausal state: Secondary | ICD-10-CM | POA: Diagnosis not present

## 2024-05-27 NOTE — Progress Notes (Unsigned)
 [facility]  PATIENT: Shelby Pace DOB: 10-08-1950  REFERRING DOCTOR OR PCP: Juliene Bailey, MD; Olam Pinal, MD SOURCE: The patient, notes from orthopedics,  _________________________________   HISTORICAL  CHIEF COMPLAINT:  Chief Complaint  Patient presents with   New Patient (Initial Visit)    Pt in room 11. Alone. proficient paper referral for lower extremity neuropathic pain.     HISTORY OF PRESENT ILLNESS:  I had the pleasure of seeing your patient, Shelby Pace, at [facility] for neurologic consultation regarding her foot numbness and pain.  She is a 73 year old patient with numbness in her toes .   The numbness started in the right    Numbness is associated with tingling but not pain.   She also has had some pain in her muscles around the upper arm.    She once had burning intermittently for months in the left foot.   She denies any weakness in her left foot but has old weakness in her right foot.   Bladder function has not changed.     She has a right AFO for ankle weakness due to Posterior tibial tendon dysfunction stage 3   She will e doing surgery likely.   She is on her feet all day and this led to a collapse of her arch R>>L.   The AFO has helped her gait.    This process began before the numbness in her toes.       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  I took a look at the primary data.  There was mild reduction of peroneal and tibial amplitude and minimal slowing.  Of note, sural sensory response was normal.  Since EMG was also normal, I thonk likelihood of a pure motor neuropathy is low.     She was once on 100 mg gabapentin but felt poorly and d/c   Imaging: Brain 01/13/2020 showed normal brain volume, and negligible white matter changes consistent with age.  There is a 7 mm meningioma on the right, unchanged compared to an MRI from 09/15/2019.  MRI of  the cervical spine 09/15/2019 showed a normal spinal cord.  There was 2 mm anterolisthesis of C7 upon T1.  Disc protrusion midline slightly to the right was noted at C2-C3 and disc bulges are noted at C4-C5, C5-C6, C6-C7 and C7-T1.  There was mild spinal stenosis at C5-C6 and C6-C7 and C7 and T1.  There is heterogenous signal and an enlarged right hemithyroid  REVIEW OF SYSTEMS: Constitutional: No fevers, chills, sweats, or change in appetite Eyes: No visual changes, double vision, eye pain Ear, nose and throat: No hearing loss, ear pain, nasal congestion, sore throat Cardiovascular: No chest pain, palpitations Respiratory:  No shortness of breath at rest or with exertion.   No wheezes GastrointestinaI: No nausea, vomiting, diarrhea, abdominal pain, fecal incontinence Genitourinary:  No dysuria, urinary retention or frequency.  No nocturia. Musculoskeletal: She reports posterior tibial tendon dysfunction stage 3 Integumentary: No rash, pruritus, skin lesions Neurological: as above Psychiatric: No depression at this time.  No anxiety Endocrine: No palpitations, diaphoresis, change in appetite, change in weigh or increased thirst Hematologic/Lymphatic:  No anemia, purpura, petechiae. Allergic/Immunologic: No itchy/runny eyes, nasal congestion, recent allergic reactions, rashes  ALLERGIES: Allergies  Allergen Reactions   Penicillamine    Penicillins Swelling and Rash    HOME MEDICATIONS:  Current Outpatient Medications:    acetaminophen  (TYLENOL ) 325 MG  tablet, 1 tablet as needed, Disp: , Rfl:    meloxicam (MOBIC) 15 MG tablet, meloxicam 15 mg tablet  Take 1 tablet every day by oral route., Disp: , Rfl:    Vitamin D, Ergocalciferol, (DRISDOL) 1.25 MG (50000 UNIT) CAPS capsule, Take 50,000 Units by mouth every 7 (seven) days., Disp: , Rfl:   Current Facility-Administered Medications:    triamcinolone  acetonide (KENALOG ) 10 MG/ML injection 10 mg, 10 mg, Other, Once, [place], [place] RAMAN,  DPM  PAST MEDICAL HISTORY: History reviewed. No pertinent past medical history.  PAST SURGICAL HISTORY: History reviewed. No pertinent surgical history.  FAMILY HISTORY: History reviewed. No pertinent family history.  SOCIAL HISTORY: Social History   Socioeconomic History   Marital status: Married    Spouse name: Not on file   Number of children: Not on file   Years of education: Not on file   Highest education level: Not on file  Occupational History   Not on file  Tobacco Use   Smoking status: Never   Smokeless tobacco: Never  Vaping Use   Vaping status: Never Used  Substance and Sexual Activity   Alcohol use: No   Drug use: No   Sexual activity: Not on file  Other Topics Concern   Not on file  Social History Narrative   Not on file   Social Drivers of Health   Financial Resource Strain: Low Risk  (11/13/2023)   Received from [facility]   Overall Financial Resource Strain (CARDIA)    Difficulty of Paying Living Expenses: Not hard at all  Food Insecurity: No Food Insecurity (11/13/2023)   Received from [facility]   Hunger Vital Sign    Within the past 12 months, you worried that your food would run out before you got the money to buy more.: Never true    Within the past 12 months, the food you bought just didn't last and you didn't have money to get more.: Never true  Transportation Needs: No Transportation Needs (11/13/2023)   Received from [facility] - Transportation    In the past 12 months, has lack of transportation kept you from medical appointments or from getting medications?: No    Lack of Transportation (Non-Medical): No  Physical Activity: Not on file  Stress: Not on file  Social Connections: Not on file  Intimate Partner Violence: Not on file       PHYSICAL EXAM  Vitals:   05/28/24 1019  BP: 122/83  Pulse: 78  SpO2: 98%  Weight: 179 lb (81.2 kg)  Height: 5' 4 (1.626 m)     Body mass index is 30.73 kg/m.   General: The patient is well-developed and well-nourished and in no acute distress.  She has a right AFO  HEENT:  Head is [place]/AT.  Sclera are anicteric.    Neck: No carotid bruits are noted.  The neck is nontender.  Cardiovascular: The heart has a regular rate and rhythm with a normal S1 and S2. There were no murmurs, gallops or rubs.    Skin: Extremities are without rash.  She has mild edema at the right ankle..  There are mild joint degenerative changes in the feet  Musculoskeletal:  Back is nontender  Neurologic Exam  Mental status: The patient is alert and oriented x 3 at the time of the examination. The patient has apparent normal recent and remote memory, with an apparently normal attention span and concentration ability.  Speech is normal.  Cranial nerves: Extraocular movements are full. . There is good facial sensation to soft touch bilaterally.Facial strength is normal.  Trapezius and sternocleidomastoid strength is normal. No dysarthria is noted.  The tongue is midline, and the patient has symmetric elevation of the soft palate. No obvious hearing deficits are noted.  Motor:  Muscle bulk is normal.   Tone is normal. Strength is 4+/5 in toe extensors on the left, 5/5 in ankle extensors on the left, 4/5 in toe extensors on the right and 4 -/5 in toe flexors.   Sensory: Sensory testing is intact to pinprick, soft touch and vibration sensation in the arms.  Vibration sensation was just mildly reduced at the toes (60%) and normal at the ankles.  Pinprick sensation was fairly normal at the toes compared to the ankles and above  Coordination: Cerebellar testing reveals good finger-nose-finger and heel-to-shin bilaterally.  Gait and station: Station is normal.   Gait is normal. Tandem gait is mildly wide but normal for age. Romberg is negative.   Reflexes: Deep tendon reflexes are symmetric and normal bilaterally.         ASSESSMENT AND  PLAN  Polyneuropathy - Plan: Sjogren's syndrome antibods(ssa + ssb), Multiple Myeloma Panel (SPEP&IFE w/QIG), Vitamin B12  Dysesthesia - Plan: Sjogren's syndrome antibods(ssa + ssb), Multiple Myeloma Panel (SPEP&IFE w/QIG), Vitamin B12  Myalgia   In summary, Ms. Haynes is a 73 year old woman who has had a several year history of foot problems on the right felt to be due to posterior tibial tendon dysfunction stage 3 who has normal recent numbness and tingling in the toes up to mid foot.  An NCV study showed findings worrisome for a pure motor axonal/demyelinating polyneuropathy.  However, given the mildness of the weakness and normal EMG, I think is more likely that she does not have a significant motorpolyneuropathy.  I cannot rule out a minor small fiber polyneuropathy and we will check blood work for SSA/SSB, SPEP/IEF and B12.  There has not been significant progression over the last couple months and I think if this is due to a polyneuropathy symptoms will likely stay in the feet.  Although she has some numbness, it is not significantly painful and she is not interested in considering gabapentin or other medication.  She will return to see us  as needed for new or worsening neurologic symptoms or based on the results of the blood work.  Thank you for asking me to see this patient.  Please let me know if I can be of further assistance with her or other patients in the future.   Shelby Fatima A. Vear, MD, [facility] 05/28/2024, 4:51 PM Certified in Neurology, Clinical Neurophysiology, Sleep Medicine and Neuroimaging  [facility] [address] [place], [place] [address] [phone]

## 2024-05-28 ENCOUNTER — Encounter: Payer: Self-pay | Admitting: Neurology

## 2024-05-28 ENCOUNTER — Ambulatory Visit (INDEPENDENT_AMBULATORY_CARE_PROVIDER_SITE_OTHER): Admitting: Neurology

## 2024-05-28 VITALS — BP 122/83 | HR 78 | Ht 64.0 in | Wt 179.0 lb

## 2024-05-28 DIAGNOSIS — G629 Polyneuropathy, unspecified: Secondary | ICD-10-CM

## 2024-05-28 DIAGNOSIS — M791 Myalgia, unspecified site: Secondary | ICD-10-CM

## 2024-05-28 DIAGNOSIS — R208 Other disturbances of skin sensation: Secondary | ICD-10-CM | POA: Diagnosis not present

## 2024-06-03 ENCOUNTER — Ambulatory Visit: Payer: Self-pay | Admitting: Neurology

## 2024-06-03 LAB — SJOGREN'S SYNDROME ANTIBODS(SSA + SSB)
ENA SSA (RO) Ab: <0.2 AI (ref 0.0–0.9)
ENA SSB (LA) Ab: <0.2 AI (ref 0.0–0.9)

## 2024-06-03 LAB — MULTIPLE MYELOMA PANEL, SERUM
Albumin SerPl Elph-Mcnc: 3.7 g/dL (ref 2.9–4.4)
Albumin/Glob SerPl: 1.2 (ref 0.7–1.7)
Alpha 1: 0.3 g/dL (ref 0.0–0.4)
Alpha2 Glob SerPl Elph-Mcnc: 0.8 g/dL (ref 0.4–1.0)
B-Globulin SerPl Elph-Mcnc: 1 g/dL (ref 0.7–1.3)
Gamma Glob SerPl Elph-Mcnc: 1.1 g/dL (ref 0.4–1.8)
Globulin, Total: 3.2 g/dL (ref 2.2–3.9)
IgA/Immunoglobulin A, Serum: 154 mg/dL (ref 64–422)
IgG (Immunoglobin G), Serum: 1160 mg/dL (ref 586–1602)
IgM (Immunoglobulin M), Srm: 143 mg/dL (ref 26–217)
Total Protein: 6.9 g/dL (ref 6.0–8.5)

## 2024-06-03 LAB — VITAMIN B12: Vitamin B-12: 532 pg/mL (ref 232–1245)
